# Patient Record
Sex: Female | Born: 1955
Health system: Southern US, Community
[De-identification: ages and names within clinical notes are randomized; demographics above are authoritative.]

## PROBLEM LIST (undated history)

## (undated) DIAGNOSIS — C50919 Malignant neoplasm of unspecified site of unspecified female breast: Secondary | ICD-10-CM

## (undated) DIAGNOSIS — K219 Gastro-esophageal reflux disease without esophagitis: Secondary | ICD-10-CM

## (undated) DIAGNOSIS — Z923 Personal history of irradiation: Secondary | ICD-10-CM

## (undated) DIAGNOSIS — M8580 Other specified disorders of bone density and structure, unspecified site: Secondary | ICD-10-CM

## (undated) DIAGNOSIS — E039 Hypothyroidism, unspecified: Secondary | ICD-10-CM

## (undated) HISTORY — DX: Other specified disorders of bone density and structure, unspecified site: M85.80

## (undated) HISTORY — PX: ROTATOR CUFF REPAIR: SHX139

## (undated) HISTORY — PX: TONSILLECTOMY: SUR1361

---

## 2000-02-21 ENCOUNTER — Other Ambulatory Visit: Admission: RE | Admit: 2000-02-21 | Discharge: 2000-02-21 | Payer: Self-pay | Admitting: *Deleted

## 2000-08-11 ENCOUNTER — Encounter (INDEPENDENT_AMBULATORY_CARE_PROVIDER_SITE_OTHER): Payer: Self-pay | Admitting: Specialist

## 2000-08-11 ENCOUNTER — Other Ambulatory Visit: Admission: RE | Admit: 2000-08-11 | Discharge: 2000-08-11 | Payer: Self-pay | Admitting: Internal Medicine

## 2001-05-22 ENCOUNTER — Ambulatory Visit (HOSPITAL_COMMUNITY): Admission: RE | Admit: 2001-05-22 | Discharge: 2001-05-22 | Payer: Self-pay | Admitting: Orthopedic Surgery

## 2001-05-22 ENCOUNTER — Encounter: Payer: Self-pay | Admitting: Orthopedic Surgery

## 2001-06-13 ENCOUNTER — Ambulatory Visit (HOSPITAL_COMMUNITY): Admission: RE | Admit: 2001-06-13 | Discharge: 2001-06-13 | Payer: Self-pay | Admitting: Gastroenterology

## 2004-01-14 ENCOUNTER — Ambulatory Visit (HOSPITAL_COMMUNITY): Admission: RE | Admit: 2004-01-14 | Discharge: 2004-01-14 | Payer: Self-pay | Admitting: Family Medicine

## 2004-07-15 ENCOUNTER — Other Ambulatory Visit: Admission: RE | Admit: 2004-07-15 | Discharge: 2004-07-15 | Payer: Self-pay | Admitting: Family Medicine

## 2005-07-18 ENCOUNTER — Other Ambulatory Visit: Admission: RE | Admit: 2005-07-18 | Discharge: 2005-07-18 | Payer: Self-pay | Admitting: Family Medicine

## 2006-10-03 ENCOUNTER — Other Ambulatory Visit: Admission: RE | Admit: 2006-10-03 | Discharge: 2006-10-03 | Payer: Self-pay | Admitting: Family Medicine

## 2007-10-09 ENCOUNTER — Other Ambulatory Visit: Admission: RE | Admit: 2007-10-09 | Discharge: 2007-10-09 | Payer: Self-pay | Admitting: Family Medicine

## 2007-11-08 ENCOUNTER — Encounter: Admission: RE | Admit: 2007-11-08 | Discharge: 2007-11-08 | Payer: Self-pay | Admitting: Orthopedic Surgery

## 2008-10-13 ENCOUNTER — Other Ambulatory Visit: Admission: RE | Admit: 2008-10-13 | Discharge: 2008-10-13 | Payer: Self-pay | Admitting: Family Medicine

## 2009-10-20 ENCOUNTER — Other Ambulatory Visit: Admission: RE | Admit: 2009-10-20 | Discharge: 2009-10-20 | Payer: Self-pay | Admitting: Family Medicine

## 2010-07-29 ENCOUNTER — Emergency Department (HOSPITAL_COMMUNITY): Admission: EM | Admit: 2010-07-29 | Discharge: 2010-07-29 | Payer: Self-pay | Admitting: Family Medicine

## 2010-10-28 ENCOUNTER — Other Ambulatory Visit: Admission: RE | Admit: 2010-10-28 | Discharge: 2010-10-28 | Payer: Self-pay | Admitting: Family Medicine

## 2011-05-06 NOTE — Procedures (Signed)
[facility]  Patient:    Diana Jacobson, Diana Jacobson                        MRN: [id number] Proc. Date: 06/13/01 Attending:  Anselmo Rod, M.D.                           Procedure Report  DATE OF BIRTH:  06-17-1956  REFERRING PHYSICIAN:  Stacie Acres. Cliffton Asters, M.D.  PROCEDURE PERFORMED:  Colonoscopy.  ENDOSCOPIST:  Anselmo Rod, M.D.  INSTRUMENT USED:  Olympus video colonoscope.  INDICATIONS FOR PROCEDURE:  Family history of colon cancer in a 55 year old white female rule out colonic polyps, masses, hemorrhoids, etc.  PREPROCEDURE PREPARATION:  Informed consent was procured from the patient. The patient was fasted for eight hours prior to the procedure and prepped with a bottle of magnesium citrate and a gallon of NuLytely the night prior to the procedure.  PREPROCEDURE PHYSICAL:  The patient had stable vital signs.  Neck supple. Chest clear to auscultation.  S1, S2 regular.  Abdomen soft with normal abdominal bowel sounds.  DESCRIPTION OF PROCEDURE:  The patient was placed in the left lateral decubitus position and sedated with 50 mg of Demerol and 5 mg of Versed intravenously.  Once the patient was adequately sedated and maintained on low-flow oxygen and continuous cardiac monitoring, the Olympus video colonoscope was advanced from the rectum to the cecum without difficulty. There was a question of an extrinsic compression of the cecum. The patient was wearing a large tampon.  No abnormalities were seen.  No masses or polyps were present.  The patient tolerated the procedure well without complications.  IMPRESSION:  Healthy-appearing colon.  RECOMMENDATIONS:   Repeat colorectal cancer screening is recommended in the next five years unless the patient were to develop any abnormal symptoms in the interim. DD:  06/13/01 TD:  06/13/01 Job: [id number]

## 2012-09-18 ENCOUNTER — Other Ambulatory Visit: Payer: Self-pay | Admitting: Dermatology

## 2012-11-28 ENCOUNTER — Other Ambulatory Visit: Payer: Self-pay | Admitting: Family Medicine

## 2012-11-28 ENCOUNTER — Other Ambulatory Visit (HOSPITAL_COMMUNITY)
Admission: RE | Admit: 2012-11-28 | Discharge: 2012-11-28 | Disposition: A | Discharge status: Home / Self Care | Payer: BC Managed Care – PPO | Source: Ambulatory Visit | Attending: Family Medicine | Admitting: Family Medicine

## 2012-11-28 DIAGNOSIS — Z124 Encounter for screening for malignant neoplasm of cervix: Secondary | ICD-10-CM | POA: Insufficient documentation

## 2012-11-28 DIAGNOSIS — Z1151 Encounter for screening for human papillomavirus (HPV): Secondary | ICD-10-CM | POA: Insufficient documentation

## 2014-10-23 ENCOUNTER — Other Ambulatory Visit: Payer: Self-pay | Admitting: Dermatology

## 2015-03-10 ENCOUNTER — Encounter: Payer: Self-pay | Admitting: Sports Medicine

## 2015-03-10 ENCOUNTER — Ambulatory Visit (INDEPENDENT_AMBULATORY_CARE_PROVIDER_SITE_OTHER): Payer: 59 | Admitting: Sports Medicine

## 2015-03-10 VITALS — BP 133/47 | Ht 65.0 in | Wt 145.0 lb

## 2015-03-10 DIAGNOSIS — M7632 Iliotibial band syndrome, left leg: Secondary | ICD-10-CM

## 2015-03-10 DIAGNOSIS — M25562 Pain in left knee: Secondary | ICD-10-CM

## 2015-03-10 NOTE — Progress Notes (Signed)
Subjective:     Patient ID: Diana Jacobson, female   DOB: 12-Aug-1956, 59 y.o.   MRN: [id number]  HPI Mrs. Diana Jacobson is a 59y.o female here today for a 2 month-history of left knee pain. Pt reports sharp pain that localizes to the lateral part of her knee, both above and below the knee joint. She denies any swelling or weakness. She reports that her symptoms are worse going downstairs, sitting for a period of time, and moving side to side while playing tennis. She has tried aleve, ibuprofen, ice, a knee brace, and resting which have helped mildly alleviate her symptoms.  Of note, pt reports that she had a left meniscal injury while playing tennis 13 years ago, which improved with strengthening exercises. She also tore her right lateral mensicus last year, which has also improved with quad strengthening exercises and no surgery.  Review of Systems Per HPI    Objective:   Physical Exam Filed Vitals:   03/10/15 1052  Height: 5\' 5"  (1.651 m)  Weight: 145 lb (65.772 kg)   General: pleasant female in NAD Left Knee: Full range of motion. No obvious effusion. Patient has tenderness to palpation both along the lateral joint line as well as over the distal insertion of the IT band. No soft tissue swelling. Good joint stability. 1+ patellofemoral crepitus. Pain with Thessaly's. Neurovascular intact distally. Walking without a limp.   Left knee ultrasound (03/10/15): Limited images were obtained. No obvious effusion. There is some bulging of the lateral meniscus with some fluid but no obvious tear. There is also some fluid at the insertion of the distal IT band.    Assessment:     Mrs. Diana Jacobson is a 59y.o female here today for a 2 month-history of left knee pain most c/w ITB syndrome vs. Lateral meniscal tear. Pt's pain is mostly at the distal ITB insertion site, but with pain along joint line and history of previous meniscal injury, an [place] was done today to evaluate her ITB, and to a lesser  extent, her lateral meniscus. With pt's symptoms and US findings of fluid at distal ITB, suspect that pt has ITB syndrome, however we cannot rule out a meniscal tear based on [place] results; discussed this and below plan with pt, which she is in agreement with.    Plan:     1. Provided pt with ITB exercise program to be completed daily, in addition to continuing her home quad strengthening exercises. 2. Start topical anti-inflammatory, like aspercreme, to help reduce pain, in addition to ibuprofen/Aleve as needed. 3. Continue icing daily and after rigorous activities. Will not give body helix at this time since this may compress ITB and worsen pt's symptoms. 4. Continue playing tennis, using pain as your guide. May use knee brace if helping pt while playing. 5. F/u appointment in 3 weeks; if symptoms not improving then I would consider a diagnostic/therapeutic intra-articular cortisone injection prior to further imaging.  Patient was seen and evaluated with the above medical student. I agree with the above plan of care.

## 2015-03-31 ENCOUNTER — Ambulatory Visit (INDEPENDENT_AMBULATORY_CARE_PROVIDER_SITE_OTHER): Payer: 59 | Admitting: Sports Medicine

## 2015-03-31 ENCOUNTER — Encounter: Payer: Self-pay | Admitting: Sports Medicine

## 2015-03-31 VITALS — BP 110/61 | Ht 65.0 in | Wt 145.0 lb

## 2015-03-31 DIAGNOSIS — M7632 Iliotibial band syndrome, left leg: Secondary | ICD-10-CM

## 2015-03-31 NOTE — Progress Notes (Signed)
   Subjective:    Patient ID: Diana Jacobson, female    DOB: [birth year], 59 y.o.   MRN: [id number]  HPI   Patient comes in today for follow-up on lateral left knee pain. Pain is improving with her hip abductor exercises. She was able to play tennis last week without any problems. She still denies any swelling. Aspercreme has not been very helpful but bracing and icing have been.    Review of Systems     Objective:   Physical Exam Well-developed, well-nourished. No acute distress.  Left hip: There is still some slight weakness with resisted hip abduction. No pain. Left knee: Still some tenderness to palpation along the distal IT band. No effusion. Slight tenderness along the joint line as well.  Patient has a mild genu valgus with standing. This is pronounced with running. Examination of her feet show pes planus and significant pronation with running especially on the right. She is able to run without a limp.       Assessment & Plan:  Improving left knee pain likely secondary to distal IT band syndrome.  Patient will continue with her hip abductor strengthening exercises. She is also doing quadriceps and hamstring strengthening exercises. She is working with a Physiological scientist. I've given her some hip external rotator exercises to add to her program as well. We are going to give her some green sports insoles with scaphoid pads to help correct her pronation. She will follow-up with me in 4 weeks. We will plan on custom orthotics at that time. She may continue to increase activity as tolerated using pain as her guide. If her knee pain persists or worsens I would start with plain x-rays to evaluate for the degree of lateral compartmental DJD or we could consider an intra-articular cortisone injection.

## 2015-04-28 ENCOUNTER — Ambulatory Visit (INDEPENDENT_AMBULATORY_CARE_PROVIDER_SITE_OTHER): Payer: 59 | Admitting: Sports Medicine

## 2015-04-28 ENCOUNTER — Encounter: Payer: Self-pay | Admitting: Sports Medicine

## 2015-04-28 VITALS — BP 101/50 | HR 68 | Ht 65.0 in | Wt 145.0 lb

## 2015-04-28 DIAGNOSIS — M216X9 Other acquired deformities of unspecified foot: Secondary | ICD-10-CM

## 2015-04-28 DIAGNOSIS — M7632 Iliotibial band syndrome, left leg: Secondary | ICD-10-CM | POA: Diagnosis not present

## 2015-04-28 NOTE — Progress Notes (Signed)
Patient ID: Diana Jacobson, female   DOB: 09/12/1956, 59 y.o.   MRN: [id number]  Patient comes in today for custom orthotics. Her knee is feeling much better. She has been able to play tennis without any pain. She has been compliant with her home exercises. She has found her green sports insoles to be very comfortable.  We constructed her a pair of custom orthotics today. She will wear these in her tennis shoes and workout shoes. She will continue with her home exercises. She can continue with activity as tolerated and follow-up with me when necessary.  Total time spent with the patient was 30 minutes with greater than 50% of the time spent in face-to-face consultation discussing orthotic construction, instruction, and fitting. Patient found the orthotics to be comfortable prior to leaving the office. There was a good correction of her pronation.  Patient was fitted for a : standard, cushioned, semi-rigid orthotic. The orthotic was heated and afterward the patient stood on the orthotic blank positioned on the orthotic stand. The patient was positioned in subtalar neutral position and 10 degrees of ankle dorsiflexion in a weight bearing stance. After completion of molding, a stable base was applied to the orthotic blank. The blank was ground to a stable position for weight bearing. Size:7 Base: Blue EVA Posting: none Additional orthotic padding: none

## 2015-12-31 DIAGNOSIS — R002 Palpitations: Secondary | ICD-10-CM | POA: Diagnosis not present

## 2015-12-31 DIAGNOSIS — M8580 Other specified disorders of bone density and structure, unspecified site: Secondary | ICD-10-CM | POA: Diagnosis not present

## 2015-12-31 DIAGNOSIS — Z124 Encounter for screening for malignant neoplasm of cervix: Secondary | ICD-10-CM | POA: Diagnosis not present

## 2015-12-31 DIAGNOSIS — E039 Hypothyroidism, unspecified: Secondary | ICD-10-CM | POA: Diagnosis not present

## 2015-12-31 DIAGNOSIS — Z0000 Encounter for general adult medical examination without abnormal findings: Secondary | ICD-10-CM | POA: Diagnosis not present

## 2015-12-31 DIAGNOSIS — Z13220 Encounter for screening for lipoid disorders: Secondary | ICD-10-CM | POA: Diagnosis not present

## 2016-01-06 DIAGNOSIS — M5416 Radiculopathy, lumbar region: Secondary | ICD-10-CM | POA: Diagnosis not present

## 2016-01-06 DIAGNOSIS — M9903 Segmental and somatic dysfunction of lumbar region: Secondary | ICD-10-CM | POA: Diagnosis not present

## 2016-01-06 DIAGNOSIS — M9901 Segmental and somatic dysfunction of cervical region: Secondary | ICD-10-CM | POA: Diagnosis not present

## 2016-01-06 DIAGNOSIS — M5033 Other cervical disc degeneration, cervicothoracic region: Secondary | ICD-10-CM | POA: Diagnosis not present

## 2016-02-03 DIAGNOSIS — M5033 Other cervical disc degeneration, cervicothoracic region: Secondary | ICD-10-CM | POA: Diagnosis not present

## 2016-02-03 DIAGNOSIS — M9903 Segmental and somatic dysfunction of lumbar region: Secondary | ICD-10-CM | POA: Diagnosis not present

## 2016-02-03 DIAGNOSIS — M9901 Segmental and somatic dysfunction of cervical region: Secondary | ICD-10-CM | POA: Diagnosis not present

## 2016-02-03 DIAGNOSIS — M5416 Radiculopathy, lumbar region: Secondary | ICD-10-CM | POA: Diagnosis not present

## 2016-02-29 DIAGNOSIS — M5416 Radiculopathy, lumbar region: Secondary | ICD-10-CM | POA: Diagnosis not present

## 2016-02-29 DIAGNOSIS — M9903 Segmental and somatic dysfunction of lumbar region: Secondary | ICD-10-CM | POA: Diagnosis not present

## 2016-02-29 DIAGNOSIS — M5033 Other cervical disc degeneration, cervicothoracic region: Secondary | ICD-10-CM | POA: Diagnosis not present

## 2016-02-29 DIAGNOSIS — M9901 Segmental and somatic dysfunction of cervical region: Secondary | ICD-10-CM | POA: Diagnosis not present

## 2016-03-25 DIAGNOSIS — Z0100 Encounter for examination of eyes and vision without abnormal findings: Secondary | ICD-10-CM | POA: Diagnosis not present

## 2016-03-30 DIAGNOSIS — M5416 Radiculopathy, lumbar region: Secondary | ICD-10-CM | POA: Diagnosis not present

## 2016-03-30 DIAGNOSIS — M5033 Other cervical disc degeneration, cervicothoracic region: Secondary | ICD-10-CM | POA: Diagnosis not present

## 2016-03-30 DIAGNOSIS — M9903 Segmental and somatic dysfunction of lumbar region: Secondary | ICD-10-CM | POA: Diagnosis not present

## 2016-03-30 DIAGNOSIS — M9901 Segmental and somatic dysfunction of cervical region: Secondary | ICD-10-CM | POA: Diagnosis not present

## 2016-04-01 DIAGNOSIS — M85852 Other specified disorders of bone density and structure, left thigh: Secondary | ICD-10-CM | POA: Diagnosis not present

## 2016-04-01 DIAGNOSIS — Z1231 Encounter for screening mammogram for malignant neoplasm of breast: Secondary | ICD-10-CM | POA: Diagnosis not present

## 2016-04-01 DIAGNOSIS — M85851 Other specified disorders of bone density and structure, right thigh: Secondary | ICD-10-CM | POA: Diagnosis not present

## 2016-04-27 DIAGNOSIS — M9901 Segmental and somatic dysfunction of cervical region: Secondary | ICD-10-CM | POA: Diagnosis not present

## 2016-04-27 DIAGNOSIS — M9903 Segmental and somatic dysfunction of lumbar region: Secondary | ICD-10-CM | POA: Diagnosis not present

## 2016-04-27 DIAGNOSIS — M5416 Radiculopathy, lumbar region: Secondary | ICD-10-CM | POA: Diagnosis not present

## 2016-04-27 DIAGNOSIS — M5033 Other cervical disc degeneration, cervicothoracic region: Secondary | ICD-10-CM | POA: Diagnosis not present

## 2016-05-25 DIAGNOSIS — M9903 Segmental and somatic dysfunction of lumbar region: Secondary | ICD-10-CM | POA: Diagnosis not present

## 2016-05-25 DIAGNOSIS — M5033 Other cervical disc degeneration, cervicothoracic region: Secondary | ICD-10-CM | POA: Diagnosis not present

## 2016-05-25 DIAGNOSIS — M9901 Segmental and somatic dysfunction of cervical region: Secondary | ICD-10-CM | POA: Diagnosis not present

## 2016-05-25 DIAGNOSIS — M5416 Radiculopathy, lumbar region: Secondary | ICD-10-CM | POA: Diagnosis not present

## 2016-06-28 DIAGNOSIS — M5033 Other cervical disc degeneration, cervicothoracic region: Secondary | ICD-10-CM | POA: Diagnosis not present

## 2016-06-28 DIAGNOSIS — M9903 Segmental and somatic dysfunction of lumbar region: Secondary | ICD-10-CM | POA: Diagnosis not present

## 2016-06-28 DIAGNOSIS — M5416 Radiculopathy, lumbar region: Secondary | ICD-10-CM | POA: Diagnosis not present

## 2016-06-28 DIAGNOSIS — M9901 Segmental and somatic dysfunction of cervical region: Secondary | ICD-10-CM | POA: Diagnosis not present

## 2016-07-26 DIAGNOSIS — M5416 Radiculopathy, lumbar region: Secondary | ICD-10-CM | POA: Diagnosis not present

## 2016-07-26 DIAGNOSIS — M5033 Other cervical disc degeneration, cervicothoracic region: Secondary | ICD-10-CM | POA: Diagnosis not present

## 2016-07-26 DIAGNOSIS — M9901 Segmental and somatic dysfunction of cervical region: Secondary | ICD-10-CM | POA: Diagnosis not present

## 2016-07-26 DIAGNOSIS — M9903 Segmental and somatic dysfunction of lumbar region: Secondary | ICD-10-CM | POA: Diagnosis not present

## 2016-07-30 DIAGNOSIS — J019 Acute sinusitis, unspecified: Secondary | ICD-10-CM | POA: Diagnosis not present

## 2016-09-06 DIAGNOSIS — M5416 Radiculopathy, lumbar region: Secondary | ICD-10-CM | POA: Diagnosis not present

## 2016-09-06 DIAGNOSIS — M9901 Segmental and somatic dysfunction of cervical region: Secondary | ICD-10-CM | POA: Diagnosis not present

## 2016-09-06 DIAGNOSIS — M9903 Segmental and somatic dysfunction of lumbar region: Secondary | ICD-10-CM | POA: Diagnosis not present

## 2016-09-06 DIAGNOSIS — M5033 Other cervical disc degeneration, cervicothoracic region: Secondary | ICD-10-CM | POA: Diagnosis not present

## 2016-09-28 ENCOUNTER — Encounter: Payer: Self-pay | Admitting: Physician Assistant

## 2016-09-28 ENCOUNTER — Ambulatory Visit: Payer: Self-pay | Admitting: Physician Assistant

## 2016-09-28 VITALS — BP 140/80 | HR 76 | Temp 98.5°F

## 2016-09-28 DIAGNOSIS — R05 Cough: Secondary | ICD-10-CM

## 2016-09-28 DIAGNOSIS — R059 Cough, unspecified: Secondary | ICD-10-CM

## 2016-09-28 DIAGNOSIS — J0101 Acute recurrent maxillary sinusitis: Secondary | ICD-10-CM

## 2016-09-28 MED ORDER — PREDNISONE 10 MG PO TABS: 30.0000 mg | ORAL_TABLET | Freq: Every day | ORAL | 0 refills | Status: DC

## 2016-09-28 MED ORDER — CEFDINIR 300 MG PO CAPS: 300.0000 mg | ORAL_CAPSULE | Freq: Two times a day (BID) | ORAL | 0 refills | Status: DC

## 2016-09-28 MED ORDER — LEVOCETIRIZINE DIHYDROCHLORIDE 5 MG PO TABS: 5.0000 mg | ORAL_TABLET | Freq: Every evening | ORAL | 12 refills | Status: DC

## 2016-09-28 NOTE — Progress Notes (Signed)
S: c/o continued sinus pain and pressure, still has a cough, will come and go, sx on and off for a month, mucus is colored, still using nasacort, xyrtec without a lot of relief, added mucinex d without relief, now has sore throat, started 3 d ago, was exposed to grandchildren that had been on antibiotic for strep, denies fever chills, cp/sob, v/d  O: vitals wnl, nad, tms dull, nasal mucosa red and swollen, throat wnl, neck supple no lymph, lungs c ta , cv rrr  A: acute sinusitis, cough  P: omnicef, xyzal instead of xyrtec, pred 30mg  qd x 3d, consider taking reflux med if cough persisits, need cxr if cough persists for more that a few weeks

## 2016-10-04 DIAGNOSIS — M5416 Radiculopathy, lumbar region: Secondary | ICD-10-CM | POA: Diagnosis not present

## 2016-10-04 DIAGNOSIS — M9901 Segmental and somatic dysfunction of cervical region: Secondary | ICD-10-CM | POA: Diagnosis not present

## 2016-10-04 DIAGNOSIS — M9903 Segmental and somatic dysfunction of lumbar region: Secondary | ICD-10-CM | POA: Diagnosis not present

## 2016-10-04 DIAGNOSIS — M5033 Other cervical disc degeneration, cervicothoracic region: Secondary | ICD-10-CM | POA: Diagnosis not present

## 2016-10-15 DIAGNOSIS — H1013 Acute atopic conjunctivitis, bilateral: Secondary | ICD-10-CM | POA: Diagnosis not present

## 2016-11-01 DIAGNOSIS — M9901 Segmental and somatic dysfunction of cervical region: Secondary | ICD-10-CM | POA: Diagnosis not present

## 2016-11-01 DIAGNOSIS — M5416 Radiculopathy, lumbar region: Secondary | ICD-10-CM | POA: Diagnosis not present

## 2016-11-01 DIAGNOSIS — M5033 Other cervical disc degeneration, cervicothoracic region: Secondary | ICD-10-CM | POA: Diagnosis not present

## 2016-11-01 DIAGNOSIS — M9903 Segmental and somatic dysfunction of lumbar region: Secondary | ICD-10-CM | POA: Diagnosis not present

## 2016-11-18 DIAGNOSIS — D225 Melanocytic nevi of trunk: Secondary | ICD-10-CM | POA: Diagnosis not present

## 2016-11-18 DIAGNOSIS — L820 Inflamed seborrheic keratosis: Secondary | ICD-10-CM | POA: Diagnosis not present

## 2016-11-18 DIAGNOSIS — D2271 Melanocytic nevi of right lower limb, including hip: Secondary | ICD-10-CM | POA: Diagnosis not present

## 2016-11-18 DIAGNOSIS — Z23 Encounter for immunization: Secondary | ICD-10-CM | POA: Diagnosis not present

## 2016-11-29 DIAGNOSIS — M5033 Other cervical disc degeneration, cervicothoracic region: Secondary | ICD-10-CM | POA: Diagnosis not present

## 2016-11-29 DIAGNOSIS — M9903 Segmental and somatic dysfunction of lumbar region: Secondary | ICD-10-CM | POA: Diagnosis not present

## 2016-11-29 DIAGNOSIS — M5416 Radiculopathy, lumbar region: Secondary | ICD-10-CM | POA: Diagnosis not present

## 2016-11-29 DIAGNOSIS — M9901 Segmental and somatic dysfunction of cervical region: Secondary | ICD-10-CM | POA: Diagnosis not present

## 2017-01-02 ENCOUNTER — Other Ambulatory Visit: Payer: Self-pay | Admitting: Family Medicine

## 2017-01-02 ENCOUNTER — Other Ambulatory Visit (HOSPITAL_COMMUNITY)
Admission: RE | Admit: 2017-01-02 | Discharge: 2017-01-02 | Disposition: A | Discharge status: Home / Self Care | Payer: 59 | Source: Ambulatory Visit | Attending: Family Medicine | Admitting: Family Medicine

## 2017-01-02 DIAGNOSIS — E039 Hypothyroidism, unspecified: Secondary | ICD-10-CM | POA: Diagnosis not present

## 2017-01-02 DIAGNOSIS — Z124 Encounter for screening for malignant neoplasm of cervix: Secondary | ICD-10-CM | POA: Diagnosis not present

## 2017-01-02 DIAGNOSIS — Z1151 Encounter for screening for human papillomavirus (HPV): Secondary | ICD-10-CM | POA: Insufficient documentation

## 2017-01-02 DIAGNOSIS — Z0000 Encounter for general adult medical examination without abnormal findings: Secondary | ICD-10-CM | POA: Diagnosis not present

## 2017-01-02 DIAGNOSIS — Z23 Encounter for immunization: Secondary | ICD-10-CM | POA: Diagnosis not present

## 2017-01-02 DIAGNOSIS — Z13220 Encounter for screening for lipoid disorders: Secondary | ICD-10-CM | POA: Diagnosis not present

## 2017-01-03 LAB — CYTOLOGY - PAP
DIAGNOSIS: NEGATIVE
HPV: NOT DETECTED

## 2017-04-20 DIAGNOSIS — Z1231 Encounter for screening mammogram for malignant neoplasm of breast: Secondary | ICD-10-CM | POA: Diagnosis not present

## 2017-04-25 DIAGNOSIS — R921 Mammographic calcification found on diagnostic imaging of breast: Secondary | ICD-10-CM | POA: Diagnosis not present

## 2017-05-03 ENCOUNTER — Other Ambulatory Visit: Payer: Self-pay | Admitting: Radiology

## 2017-05-03 DIAGNOSIS — R599 Enlarged lymph nodes, unspecified: Secondary | ICD-10-CM | POA: Diagnosis not present

## 2017-05-03 DIAGNOSIS — C50911 Malignant neoplasm of unspecified site of right female breast: Secondary | ICD-10-CM | POA: Diagnosis not present

## 2017-05-03 DIAGNOSIS — C50919 Malignant neoplasm of unspecified site of unspecified female breast: Secondary | ICD-10-CM

## 2017-05-03 DIAGNOSIS — D0591 Unspecified type of carcinoma in situ of right breast: Secondary | ICD-10-CM | POA: Diagnosis not present

## 2017-05-03 DIAGNOSIS — C50411 Malignant neoplasm of upper-outer quadrant of right female breast: Secondary | ICD-10-CM | POA: Diagnosis not present

## 2017-05-03 HISTORY — DX: Malignant neoplasm of unspecified site of unspecified female breast: C50.919

## 2017-05-03 HISTORY — PX: BREAST BIOPSY: SHX20

## 2017-05-05 ENCOUNTER — Telehealth: Payer: Self-pay | Admitting: *Deleted

## 2017-05-05 NOTE — Telephone Encounter (Signed)
Left message for a return phone call to schedule for [facility] 05/10/17.

## 2017-05-05 NOTE — Telephone Encounter (Signed)
Patient is unsure whether she wants to pick her own team or come to clinic.  She will call me back and let me know.

## 2017-05-08 ENCOUNTER — Telehealth: Payer: Self-pay | Admitting: *Deleted

## 2017-05-08 NOTE — Telephone Encounter (Signed)
Received message back from patient that she will come to the [facility] 5/23.  Confirmed [facility] for 05/10/17 at 12:15pm .  Instructions and contact information given.

## 2017-05-09 ENCOUNTER — Other Ambulatory Visit: Payer: Self-pay

## 2017-05-09 ENCOUNTER — Other Ambulatory Visit: Payer: Self-pay | Admitting: *Deleted

## 2017-05-09 DIAGNOSIS — C50411 Malignant neoplasm of upper-outer quadrant of right female breast: Secondary | ICD-10-CM | POA: Insufficient documentation

## 2017-05-09 DIAGNOSIS — Z170 Estrogen receptor positive status [ER+]: Principal | ICD-10-CM

## 2017-05-10 ENCOUNTER — Encounter: Payer: Self-pay | Admitting: Hematology and Oncology

## 2017-05-10 ENCOUNTER — Ambulatory Visit
Admission: RE | Admit: 2017-05-10 | Discharge: 2017-05-10 | Disposition: A | Discharge status: Home / Self Care | Payer: 59 | Source: Ambulatory Visit | Attending: Radiation Oncology | Admitting: Radiation Oncology

## 2017-05-10 ENCOUNTER — Ambulatory Visit (HOSPITAL_BASED_OUTPATIENT_CLINIC_OR_DEPARTMENT_OTHER): Payer: 59 | Admitting: Hematology and Oncology

## 2017-05-10 ENCOUNTER — Ambulatory Visit: Payer: 59 | Attending: General Surgery | Admitting: Physical Therapy

## 2017-05-10 ENCOUNTER — Other Ambulatory Visit: Payer: Self-pay | Admitting: General Surgery

## 2017-05-10 ENCOUNTER — Encounter: Payer: Self-pay | Admitting: Physical Therapy

## 2017-05-10 ENCOUNTER — Encounter: Payer: Self-pay | Admitting: Radiation Oncology

## 2017-05-10 ENCOUNTER — Other Ambulatory Visit (HOSPITAL_BASED_OUTPATIENT_CLINIC_OR_DEPARTMENT_OTHER): Payer: 59

## 2017-05-10 DIAGNOSIS — C50411 Malignant neoplasm of upper-outer quadrant of right female breast: Secondary | ICD-10-CM | POA: Diagnosis not present

## 2017-05-10 DIAGNOSIS — Z170 Estrogen receptor positive status [ER+]: Principal | ICD-10-CM

## 2017-05-10 DIAGNOSIS — C50511 Malignant neoplasm of lower-outer quadrant of right female breast: Secondary | ICD-10-CM

## 2017-05-10 DIAGNOSIS — R293 Abnormal posture: Secondary | ICD-10-CM | POA: Diagnosis not present

## 2017-05-10 LAB — CBC WITH DIFFERENTIAL/PLATELET
BASO%: 0.7 % (ref 0.0–2.0)
BASOS ABS: 0 10*3/uL (ref 0.0–0.1)
EOS%: 2.4 % (ref 0.0–7.0)
Eosinophils Absolute: 0.1 10*3/uL (ref 0.0–0.5)
HEMATOCRIT: 40.8 % (ref 34.8–46.6)
HEMOGLOBIN: 13.7 g/dL (ref 11.6–15.9)
LYMPH#: 1.3 10*3/uL (ref 0.9–3.3)
LYMPH%: 27.8 % (ref 14.0–49.7)
MCH: 30.4 pg (ref 25.1–34.0)
MCHC: 33.6 g/dL (ref 31.5–36.0)
MCV: 90.5 fL (ref 79.5–101.0)
MONO#: 0.4 10*3/uL (ref 0.1–0.9)
MONO%: 8 % (ref 0.0–14.0)
NEUT%: 61.1 % (ref 38.4–76.8)
NEUTROS ABS: 2.8 10*3/uL (ref 1.5–6.5)
Platelets: 229 10*3/uL (ref 145–400)
RBC: 4.51 10*6/uL (ref 3.70–5.45)
RDW: 13.1 % (ref 11.2–14.5)
WBC: 4.6 10*3/uL (ref 3.9–10.3)

## 2017-05-10 LAB — COMPREHENSIVE METABOLIC PANEL
ALT: 14 U/L (ref 0–55)
AST: 18 U/L (ref 5–34)
Albumin: 4.2 g/dL (ref 3.5–5.0)
Alkaline Phosphatase: 75 U/L (ref 40–150)
Anion Gap: 7 mEq/L (ref 3–11)
BUN: 15.5 mg/dL (ref 7.0–26.0)
CALCIUM: 9.8 mg/dL (ref 8.4–10.4)
CHLORIDE: 106 meq/L (ref 98–109)
CO2: 28 meq/L (ref 22–29)
Creatinine: 0.8 mg/dL (ref 0.6–1.1)
EGFR: 75 mL/min/{1.73_m2} — ABNORMAL LOW (ref >90)
GLUCOSE: 94 mg/dL (ref 70–140)
Potassium: 4.2 mEq/L (ref 3.5–5.1)
SODIUM: 140 meq/L (ref 136–145)
Total Bilirubin: 0.49 mg/dL (ref 0.20–1.20)
Total Protein: 6.9 g/dL (ref 6.4–8.3)

## 2017-05-10 MED ORDER — KETOROLAC TROMETHAMINE 15 MG/ML IJ SOLN: 15.0000 mg | Freq: Once | INTRAMUSCULAR | Status: AC

## 2017-05-10 NOTE — Patient Instructions (Signed)

## 2017-05-10 NOTE — Assessment & Plan Note (Signed)
05/03/2017: Screening detected right breast calcifications 7 mm, right axilla negative, tumor biopsy grade 3 IDC with DCIS plus LCIS ER 100%, PR 2%, HER-2 negative ratio 1.45, Ki-67 25%, T1 BN 0 stage IA clinical stage  Pathology and radiology counseling:Discussed with the patient, the details of pathology including the type of breast cancer,the clinical staging, the significance of ER, PR and HER-2/neu receptors and the implications for treatment. After reviewing the pathology in detail, we proceeded to discuss the different treatment options between surgery, radiation, chemotherapy, antiestrogen therapies.  Recommendations: 1. Breast conserving surgery followed by 2. Oncotype DX testing to determine if chemotherapy would be of any benefit followed by 3. Adjuvant radiation therapy followed by 4. Adjuvant antiestrogen therapy  Oncotype counseling: I discussed Oncotype DX test. I explained to the patient that this is a 21 gene panel to evaluate patient tumors DNA to calculate recurrence score. This would help determine whether patient has high risk or intermediate risk or low risk breast cancer. She understands that if her tumor was found to be high risk, she would benefit from systemic chemotherapy. If low risk, no need of chemotherapy. If she was found to be intermediate risk, we would need to evaluate the score as well as other risk factors and determine if an abbreviated chemotherapy may be of benefit.  Return to clinic after surgery to discuss final pathology report and then determine if Oncotype DX testing will need to be sent.

## 2017-05-10 NOTE — Progress Notes (Signed)
Radiation Oncology         [phone] ________________________________  Initial Outpatient Consultation  Name: Diana Jacobson MRN: [id number]  Date: 05/10/2017  DOB: 1956/05/29  CC:Harlan Stains, MD  Rolm Bookbinder, MD   REFERRING PHYSICIAN: Rolm Bookbinder, MD  DIAGNOSIS: The encounter diagnosis was Malignant neoplasm of upper-outer quadrant of right breast in female, estrogen receptor positive ([place]).  Clinical Stage IB (T1b Nx Mx) Right Breast UOQ Invasive Ductal Carcinoma, ER 100%, PR 2%, Her2 negative, grade 3  HISTORY OF PRESENT ILLNESS::Diana Jacobson is a 61 y.o. female who initially presented with screening detected right breast calcifications at [facility].  Biopsy of the right breast on 05/03/17 revealed invasive mammary carcinoma and mammary carcinoma in situ, grade 3, and measuring 0.9 cm in greatest dimension.  On review of systems, the patient is positive for loss of sleep, ongoing arthritis, and thyroid problems.  Gynecologic History: Age at first menstrual period? 11 Are you still having periods? No  Approximate date of last period? December 2009 If you no longer have periods: Have you used hormone replacement? Yes, for 6 months. Stopped 2010. Obstetric History:  How many children have you carried to term? 4  Your age at first live birth? 49 Pregnant now or trying to get pregnant? No Have you used birth control pills or hormone shots for contraception? Yes If so, for how long (or approximate dates)? 3 months Health Maintenance: Have you ever had a colonoscopy? Yes  If yes, date? 2011 Have you ever had a bone density? Yes Date of your last PAP smear? January 2018  Date of your FIRST mammogram? 1990  PREVIOUS RADIATION THERAPY: No  PAST MEDICAL HISTORY:  has a past medical history of Osteopenia.    PAST SURGICAL HISTORY: Past Surgical History:  Procedure Laterality Date  . ROTATOR CUFF REPAIR    . TONSILLECTOMY      FAMILY HISTORY: family history  includes Cancer (age of onset: 37) in her cousin; Cancer (age of onset: 69) in her mother; Cancer (age of onset: 86) in her father; Cancer (age of onset: 86) in her maternal grandmother; Colon cancer in her father and maternal grandmother; Lung cancer in her mother.  SOCIAL HISTORY:  reports that she quit smoking about 42 years ago. She does not have any smokeless tobacco history on file. She reports that she drinks about 3.0 oz of alcohol per week . She reports that she does not use drugs.  ALLERGIES: Patient has no known allergies.  MEDICATIONS:  Current Outpatient Prescriptions  Medication Sig Dispense Refill  . Bioflavonoid Products (ESTER-C) 500-200-60 MG TABS Take 1 tablet by mouth daily.    . Calcium 500-125 MG-UNIT TABS Take 2 tablets by mouth daily.    . cefdinir (OMNICEF) 300 MG capsule Take 1 capsule (300 mg total) by mouth 2 (two) times daily. (Patient not taking: Reported on 05/10/2017) 20 capsule 0  . glucosamine-chondroitin 500-400 MG tablet Take 1 tablet by mouth daily.    Marland Kitchen levocetirizine (XYZAL) 5 MG tablet Take 1 tablet (5 mg total) by mouth every evening. 30 tablet 12  . levothyroxine (SYNTHROID, LEVOTHROID) 88 MCG tablet Take 88 mcg by mouth daily before breakfast.    . Multiple Vitamin (MULTIVITAMIN) tablet Take 1 tablet by mouth daily.    . NON FORMULARY Take 1 tablet by mouth daily. DIGEST ZEN TABLET    . NON FORMULARY Take 1 tablet by mouth daily. phytoestrogen for hot flashes    . predniSONE (DELTASONE) 10 MG  tablet Take 3 tablets (30 mg total) by mouth daily with breakfast. (Patient not taking: Reported on 05/10/2017) 9 tablet 0   No current facility-administered medications for this encounter.     REVIEW OF SYSTEMS:  A 10+ POINT REVIEW OF SYSTEMS WAS OBTAINED including neurology, dermatology, psychiatry, cardiac, respiratory, lymph, extremities, GI, GU, musculoskeletal, constitutional, reproductive, HEENT. All pertinent positives are noted in the HPI. All others are  negative.   PHYSICAL EXAM:  Vitals with BMI 05/10/2017  Height 5' 5"   Weight 156 lbs 2 oz  BMI 26  Systolic 001  Diastolic 48  Pulse 68  Respirations 18  Lungs are clear to auscultation bilaterally. Heart has regular rate and rhythm. No palpable cervical, supraclavicular, or axillary adenopathy.  Left breast shows no palpable mass or nipple discharge. Right breast shows bruising in upper-outer quadrant with extension of bruising into the lower-outer quadrant. There is a steri-strip in place in the upper-outer quadrant. No palpable right breast mass, appreciable nipple discharge, or bleeding.   ECOG = 1   LABORATORY DATA:  Lab Results  Component Value Date   WBC 4.6 05/10/2017   HGB 13.7 05/10/2017   HCT 40.8 05/10/2017   MCV 90.5 05/10/2017   PLT 229 05/10/2017   NEUTROABS 2.8 05/10/2017   Lab Results  Component Value Date   NA 140 05/10/2017   K 4.2 05/10/2017   CO2 28 05/10/2017   GLUCOSE 94 05/10/2017   CREATININE 0.8 05/10/2017   CALCIUM 9.8 05/10/2017      RADIOGRAPHY: [facility] images reviewed    IMPRESSION: This is a 61 y.o. woman found to have Clinical stage IB invasive ductal carcinoma of the right breast, grade 3. Patient is a good candidate for breast conservation therapy with right lumpectomy and sentinel lymph node procedure followed by adjuvant radiation therapy.  PLAN: The patient works with [facility] at [place] and may wish to have her radiation therapy performed at that location. She will also have Oncotype Dx testing to see if she would be a good candidate for adjuvant chemotherapy. She will proceed with aromatase inhibitor at a later date as adjuvant hormonal therapy.      ------------------------------------------------  Blair Promise, PhD, MD  This document serves as a record of services personally performed by Gery Pray, MD. It was created on his behalf by Maryla Morrow, a trained medical scribe. The creation of this record is  based on the scribe's personal observations and the provider's statements to them. This document has been checked and approved by the attending provider.

## 2017-05-10 NOTE — Therapy (Signed)
[place], [place], [address] Phone: [phone]   Fax:  [phone]  Physical Therapy Evaluation  Patient Details  Name: Diana Jacobson MRN: [id number] Date of Birth: [birth year] Referring Provider: Dr. Rolm Bookbinder  Encounter Date: 05/10/2017      PT End of Session - 05/10/17 1640    Visit Number 1   Number of Visits 1   PT Start Time 0623   PT Stop Time 1428   PT Time Calculation (min) 33 min   Activity Tolerance Patient tolerated treatment well   Behavior During Therapy [facility] for tasks assessed/performed      Past Medical History:  Diagnosis Date  . Osteopenia     Past Surgical History:  Procedure Laterality Date  . ROTATOR CUFF REPAIR    . TONSILLECTOMY      There were no vitals filed for this visit.       Subjective Assessment - 05/10/17 1628    Subjective Patient reports she is here to meet with her medical team for her newly diagnosed right breast cancer.   Patient is accompained by: Family member   Pertinent History Patient was diagnosed on 04/20/17 with right invasive ductal carcinoma breast cancer. It measures 7 mm and is located in the upper outer quadrant. It is ER/PR positive and HER2 negative with a Ki67 of 25%. She had a previous right rotator cuff repair in 2011 from a traumatic fall. She regained ROM in rehab after that.   Patient Stated Goals Reduce lymphedema risk and learn post op shoulder ROM HEP   Currently in Pain? No/denies            [facility] PT Assessment - 05/10/17 0001      Assessment   Medical Diagnosis Right breast cancer   Referring Provider Dr. Rolm Bookbinder   Onset Date/Surgical Date 04/20/17   Hand Dominance Right   Prior Therapy none     Precautions   Precautions Other (comment)   Precaution Comments active cancer     Restrictions   Weight Bearing Restrictions No     Balance Screen   Has the patient fallen in the past 6 months No    Has the patient had a decrease in activity level because of a fear of falling?  No   Is the patient reluctant to leave their home because of a fear of falling?  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   Leisure Tennis 2x/week and personal trainer 1x/week; walks 3x/week     Cognition   Overall Cognitive Status Within Functional Limits for tasks assessed     Posture/Postural Control   Posture/Postural Control Postural limitations   Postural Limitations Forward head;Rounded Shoulders     ROM / Strength   AROM / PROM / Strength Strength;AROM     AROM   AROM Assessment Site Shoulder;Cervical   Right/Left Shoulder Right;Left   Right Shoulder Extension 42 Degrees   Right Shoulder Flexion 142 Degrees   Right Shoulder ABduction 172 Degrees   Right Shoulder Internal Rotation 79 Degrees   Right Shoulder External Rotation 81 Degrees   Left Shoulder Extension 57 Degrees   Left Shoulder Flexion 152 Degrees  Left Shoulder ABduction 172 Degrees   Left Shoulder Internal Rotation 69 Degrees   Left Shoulder External Rotation 86 Degrees   Cervical Flexion WNL   Cervical Extension WNL   Cervical - Right Side Bend 25% limited   Cervical - Left Side Bend 25% limited   Cervical - Right Rotation 25% limited   Cervical - Left Rotation 25% limited     Strength   Overall Strength Within functional limits for tasks performed           LYMPHEDEMA/ONCOLOGY QUESTIONNAIRE - 05/10/17 1638      Type   Cancer Type Right breast cancer     Lymphedema Assessments   Lymphedema Assessments Upper extremities     Right Upper Extremity Lymphedema   10 cm Proximal to Olecranon Process 28.5 cm   Olecranon Process 25 cm    10 cm Proximal to Ulnar Styloid Process 21 cm   Just Proximal to Ulnar Styloid Process 15.3 cm   Across Hand at [employer] 18.2 cm   At [place] of 2nd Digit 6.2 cm     Left Upper Extremity Lymphedema   10 cm Proximal to Olecranon Process 29.2 cm   Olecranon Process 24.1 cm   10 cm Proximal to Ulnar Styloid Process 20.9 cm   Just Proximal to Ulnar Styloid Process 15.2 cm   Across Hand at [employer] 17.7 cm   At [place] of 2nd Digit 6 cm      Patient was instructed today in a home exercise program today for post op shoulder range of motion. These included active assist shoulder flexion in sitting, scapular retraction, wall walking with shoulder abduction, and hands behind head external rotation.  She was encouraged to do these twice a day, holding 3 seconds and repeating 5 times when permitted by her physician.         PT Education - 05/10/17 1639    Education provided Yes   Education Details Lymphedema risk reduction and post op shoulder ROM HEP   Person(s) Educated Patient;Spouse   Methods Explanation;Demonstration;Handout   Comprehension Returned demonstration;Verbalized understanding              Breast Clinic Goals - 05/10/17 1643      Patient will be able to verbalize understanding of pertinent lymphedema risk reduction practices relevant to her diagnosis specifically related to skin care.   Time 1   Period Days   Status Achieved     Patient will be able to return demonstrate and/or verbalize understanding of the post-op home exercise program related to regaining shoulder range of motion.   Time 1   Period Days   Status Achieved     Patient will be able to verbalize understanding of the importance of attending the postoperative After Breast Cancer Class for further lymphedema risk reduction education and therapeutic exercise.   Time 1   Period Days   Status Achieved              Plan - 05/10/17 1640    Clinical Impression Statement Patient was  diagnosed on 04/20/17 with right invasive ductal carcinoma breast cancer. It measures 7 mm and is located in the upper outer quadrant. It is ER/PR positive and HER2 negative with a Ki67 of 25%. She had a previous right rotator cuff repair in 2011 from a traumatic fall. She regained ROM in rehab after that. Her multidisciplinary medical team met prior to hre assessments to determine a recommended treatment plan. She is planning  to have a right lumpectomy and sentinel node biopsy followed by Oncotype testing, radiation, and anti-estrogen therapy. She may benefit from post op PT to regain shoulder ROM and reduce lymphedema risk. Due to her lack of comorbidities, her eval is low complexity.   Rehab Potential Excellent   Clinical Impairments Affecting Rehab Potential None   PT Frequency One time visit   PT Treatment/Interventions Patient/family education;Therapeutic exercise   PT Next Visit Plan Will f/u after surgery to determine PT needs   PT Home Exercise Plan Post op shoulder ROM HEP   Consulted and Agree with Plan of Care Patient;Family member/caregiver   Family Member Consulted Husband      Patient will benefit from skilled therapeutic intervention in order to improve the following deficits and impairments:  Postural dysfunction, Decreased knowledge of precautions, Pain, Impaired UE functional use, Decreased range of motion  Visit Diagnosis: Carcinoma of upper-outer quadrant of right breast in female, estrogen receptor positive ([facility]) - Plan: PT plan of care cert/re-cert  Abnormal posture - Plan: PT plan of care cert/re-cert   Patient will follow up at outpatient cancer rehab if needed following surgery.  If the patient requires physical therapy at that time, a specific plan will be dictated and sent to the referring physician for approval. The patient was educated today on appropriate basic range of motion exercises to begin post operatively and the importance of attending the After Breast Cancer  class following surgery.  Patient was educated today on lymphedema risk reduction practices as it pertains to recommendations that will benefit the patient immediately following surgery.  She verbalized good understanding.  No additional physical therapy is indicated at this time.     Problem List Patient Active Problem List   Diagnosis Date Noted  . Malignant neoplasm of upper-outer quadrant of right breast in female, estrogen receptor positive ([facility]) 05/09/2017    Annia Friendly, PT 05/10/17 4:45 PM  [place], [place], [address] Phone: [phone]   Fax:  [phone]  Name: Diana Jacobson MRN: [id number] Date of Birth: [birth year]

## 2017-05-10 NOTE — Progress Notes (Signed)
[facility] NOTE  Patient Care Team: Harlan Stains, MD as PCP - General (Family Medicine) Rolm Bookbinder, MD as Consulting Physician (General Surgery) Nicholas Lose, MD as Consulting Physician (Hematology and Oncology) Gery Pray, MD as Consulting Physician (Radiation Oncology)  CHIEF COMPLAINTS/PURPOSE OF CONSULTATION:  Newly diagnosed breast cancer  HISTORY OF PRESENTING ILLNESS:  Diana Jacobson 61 y.o. female is here because of recent diagnosis of right breast cancer. Patient had a screening mammogram the detected right breast calcifications measuring 7 mm in size. Right axilla was negative. Tumor biopsy revealing grade 3 invasive ductal carcinoma with DCIS and LCIS. The tumor is ER/PR positive HER-2 negative with a Ki-67 25%. She was presented this morning in the multidisciplinary tumor board and she is here today to discuss the treatment.  I reviewed her records extensively and collaborated the history with the patient.  SUMMARY OF ONCOLOGIC HISTORY:   Malignant neoplasm of upper-outer quadrant of right breast in female, estrogen receptor positive ([facility])   05/03/2017 Initial Diagnosis    Screening detected right breast calcifications 7 mm, right axilla negative, tumor biopsy grade 3 IDC with DCIS plus LCIS ER 100%, PR 2%, HER-2 negative ratio 1.45, Ki-67 25%, T1 BN 0 stage IA clinical stage      MEDICAL HISTORY:  Past Medical History:  Diagnosis Date  . Osteopenia     SURGICAL HISTORY: Past Surgical History:  Procedure Laterality Date  . ROTATOR CUFF REPAIR    . TONSILLECTOMY      SOCIAL HISTORY: Social History   Social History  . Marital status: Married    Spouse name: N/A  . Number of children: N/A  . Years of education: N/A   Occupational History  . Not on file.   Social History Main Topics  . Smoking status: Former Smoker    Quit date: 05/11/1975  . Smokeless tobacco: Not on file  . Alcohol use 3.0 oz/week    5 Glasses of  wine per week  . Drug use: No  . Sexual activity: Not on file   Other Topics Concern  . Not on file   Social History Narrative  . No narrative on file    FAMILY HISTORY: Family History  Problem Relation Age of Onset  . Lung cancer Mother   . Cancer Mother 76       Lung  . Colon cancer Father   . Cancer Father 3       Stomach or Colon  . Colon cancer Maternal Grandmother   . Cancer Maternal Grandmother 60       Colon  . Cancer Cousin 40       Colon    ALLERGIES:  has No Known Allergies.  MEDICATIONS:  Current Outpatient Prescriptions  Medication Sig Dispense Refill  . Bioflavonoid Products (ESTER-C) 500-200-60 MG TABS Take 1 tablet by mouth daily.    . Calcium 500-125 MG-UNIT TABS Take 2 tablets by mouth daily.    Marland Kitchen glucosamine-chondroitin 500-400 MG tablet Take 1 tablet by mouth daily.    Marland Kitchen levocetirizine (XYZAL) 5 MG tablet Take 1 tablet (5 mg total) by mouth every evening. 30 tablet 12  . levothyroxine (SYNTHROID, LEVOTHROID) 88 MCG tablet Take 88 mcg by mouth daily before breakfast.    . Multiple Vitamin (MULTIVITAMIN) tablet Take 1 tablet by mouth daily.    . NON FORMULARY Take 1 tablet by mouth daily. DIGEST ZEN TABLET    . NON FORMULARY Take 1 tablet by mouth daily. phytoestrogen for  hot flashes    . cefdinir (OMNICEF) 300 MG capsule Take 1 capsule (300 mg total) by mouth 2 (two) times daily. (Patient not taking: Reported on 05/10/2017) 20 capsule 0  . predniSONE (DELTASONE) 10 MG tablet Take 3 tablets (30 mg total) by mouth daily with breakfast. (Patient not taking: Reported on 05/10/2017) 9 tablet 0   No current facility-administered medications for this visit.     REVIEW OF SYSTEMS:   Constitutional: Denies fevers, chills or abnormal night sweats Eyes: Denies blurriness of vision, double vision or watery eyes Ears, nose, mouth, throat, and face: Denies mucositis or sore throat Respiratory: Denies cough, dyspnea or wheezes Cardiovascular: Denies palpitation,  chest discomfort or lower extremity swelling Gastrointestinal:  Denies nausea, heartburn or change in bowel habits Skin: Denies abnormal skin rashes Lymphatics: Denies new lymphadenopathy or easy bruising Neurological:Denies numbness, tingling or new weaknesses Behavioral/Psych: Mood is stable, no new changes  Breast:  Denies any palpable lumps or discharge All other systems were reviewed with the patient and are negative.  PHYSICAL EXAMINATION: ECOG PERFORMANCE STATUS: 0 - Asymptomatic  Vitals:   05/10/17 1255  BP: (!) 131/48  Pulse: 68  Resp: 18  Temp: 97.5 F (36.4 C)   Filed Weights   05/10/17 1255  Weight: 156 lb 1.6 oz (70.8 kg)    GENERAL:alert, no distress and comfortable SKIN: skin color, texture, turgor are normal, no rashes or significant lesions EYES: normal, conjunctiva are pink and non-injected, sclera clear OROPHARYNX:no exudate, no erythema and lips, buccal mucosa, and tongue normal  NECK: supple, thyroid normal size, non-tender, without nodularity LYMPH:  no palpable lymphadenopathy in the cervical, axillary or inguinal LUNGS: clear to auscultation and percussion with normal breathing effort HEART: regular rate & rhythm and no murmurs and no lower extremity edema ABDOMEN:abdomen soft, non-tender and normal bowel sounds Musculoskeletal:no cyanosis of digits and no clubbing  PSYCH: alert & oriented x 3 with fluent speech NEURO: no focal motor/sensory deficits BREAST: No palpable nodules in breast. No palpable axillary or supraclavicular lymphadenopathy (exam performed in the presence of a chaperone)   LABORATORY DATA:  I have reviewed the data as listed Lab Results  Component Value Date   WBC 4.6 05/10/2017   HGB 13.7 05/10/2017   HCT 40.8 05/10/2017   MCV 90.5 05/10/2017   PLT 229 05/10/2017   Lab Results  Component Value Date   NA 140 05/10/2017   K 4.2 05/10/2017   CO2 28 05/10/2017    RADIOGRAPHIC STUDIES: I have personally reviewed the  radiological reports and agreed with the findings in the report.  ASSESSMENT AND PLAN:  Malignant neoplasm of upper-outer quadrant of right breast in female, estrogen receptor positive ([facility]) 05/03/2017: Screening detected right breast calcifications 7 mm, right axilla negative, tumor biopsy grade 3 IDC with DCIS plus LCIS ER 100%, PR 2%, HER-2 negative ratio 1.45, Ki-67 25%, T1 BN 0 stage IA clinical stage  Pathology and radiology counseling:Discussed with the patient, the details of pathology including the type of breast cancer,the clinical staging, the significance of ER, PR and HER-2/neu receptors and the implications for treatment. After reviewing the pathology in detail, we proceeded to discuss the different treatment options between surgery, radiation, chemotherapy, antiestrogen therapies.  Recommendations: 1. Breast conserving surgery followed by 2. Oncotype DX testing to determine if chemotherapy would be of any benefit followed by 3. Adjuvant radiation therapy followed by 4. Adjuvant antiestrogen therapy  Oncotype counseling: I discussed Oncotype DX test. I explained to the patient that this   is a 21 gene panel to evaluate patient tumors DNA to calculate recurrence score. This would help determine whether patient has high risk or intermediate risk or low risk breast cancer. She understands that if her tumor was found to be high risk, she would benefit from systemic chemotherapy. If low risk, no need of chemotherapy. If she was found to be intermediate risk, we would need to evaluate the score as well as other risk factors and determine if an abbreviated chemotherapy may be of benefit.  Return to clinic after surgery to discuss final pathology report and then determine if Oncotype DX testing will need to be sent.     All questions were answered. The patient knows to call the clinic with any problems, questions or concerns.    ,  K, MD 05/10/17  

## 2017-05-11 ENCOUNTER — Encounter: Payer: Self-pay | Admitting: General Practice

## 2017-05-11 NOTE — Progress Notes (Signed)
[facility] Psychosocial Distress Screening Spiritual Care  Met with Diana Jacobson and her husband in [facility] to introduce [facility] team/resources, reviewing distress screen per protocol.  The patient scored a 5 on the Psychosocial Distress Thermometer which indicates moderate distress. Also assessed for distress and other psychosocial needs.   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 She and her husband report good support.  We talked about not being able to counsel oneself and the importance of having a wider network.  Per pt, her biggest stressor right now is fielding insensitive/unconstructive comments from others; she is mindful of setting very clear boundaries about what she wants and does not want to hear/discuss.  Diana Jacobson is interested in support programming and will consider options in [place] (where she lives) and [place] (where she works). She knows to reach out with any questions or concerns.  Follow up needed: No. Per couple, no other needs at this time.  They have full packet of support center team/resource info, but please also page if immediate needs arise. Thank you.   [place], [place], [facility] Pager [phone] Voicemail [phone]

## 2017-05-17 ENCOUNTER — Telehealth: Payer: Self-pay | Admitting: Hematology and Oncology

## 2017-05-17 ENCOUNTER — Telehealth: Payer: Self-pay | Admitting: *Deleted

## 2017-05-17 NOTE — Telephone Encounter (Signed)
lvm to inform pt of 6/25 appt at 1400 per sch msg

## 2017-05-17 NOTE — Telephone Encounter (Signed)
  Oncology Nurse Navigator Documentation  Navigator Location: [facility] (05/17/17 1400)   )Navigator Encounter Type: Telephone (05/17/17 1400) Telephone: Outgoing Call;Clinic/[facility]C Follow-up (05/17/17 1400)     Surgery Date: 05/29/17 (05/17/17 1400)           Treatment Initiated Date: 05/29/17 (05/17/17 1400)                                Time Spent with Patient: 15 (05/17/17 1400)

## 2017-05-22 ENCOUNTER — Encounter (HOSPITAL_BASED_OUTPATIENT_CLINIC_OR_DEPARTMENT_OTHER): Payer: Self-pay | Admitting: *Deleted

## 2017-05-25 DIAGNOSIS — C50411 Malignant neoplasm of upper-outer quadrant of right female breast: Secondary | ICD-10-CM | POA: Diagnosis not present

## 2017-05-25 NOTE — Progress Notes (Signed)
Pt in to pick up her Boost Breeze, instructions reviewed.

## 2017-05-29 ENCOUNTER — Encounter (HOSPITAL_BASED_OUTPATIENT_CLINIC_OR_DEPARTMENT_OTHER): Payer: Self-pay | Admitting: Anesthesiology

## 2017-05-29 ENCOUNTER — Ambulatory Visit (HOSPITAL_COMMUNITY)
Admission: RE | Admit: 2017-05-29 | Discharge: 2017-05-29 | Disposition: A | Discharge status: Home / Self Care | Payer: 59 | Source: Ambulatory Visit | Attending: General Surgery | Admitting: General Surgery

## 2017-05-29 ENCOUNTER — Ambulatory Visit (HOSPITAL_BASED_OUTPATIENT_CLINIC_OR_DEPARTMENT_OTHER): Payer: 59 | Admitting: Anesthesiology

## 2017-05-29 ENCOUNTER — Ambulatory Visit (HOSPITAL_BASED_OUTPATIENT_CLINIC_OR_DEPARTMENT_OTHER)
Admission: RE | Admit: 2017-05-29 | Discharge: 2017-05-29 | Disposition: A | Discharge status: Home / Self Care | Payer: 59 | Source: Ambulatory Visit | Attending: General Surgery | Admitting: General Surgery

## 2017-05-29 ENCOUNTER — Encounter (HOSPITAL_BASED_OUTPATIENT_CLINIC_OR_DEPARTMENT_OTHER)
Admission: RE | Disposition: A | Discharge status: Home / Self Care | Payer: Self-pay | Source: Ambulatory Visit | Attending: General Surgery

## 2017-05-29 DIAGNOSIS — Z803 Family history of malignant neoplasm of breast: Secondary | ICD-10-CM | POA: Diagnosis not present

## 2017-05-29 DIAGNOSIS — M8580 Other specified disorders of bone density and structure, unspecified site: Secondary | ICD-10-CM | POA: Diagnosis not present

## 2017-05-29 DIAGNOSIS — Z9889 Other specified postprocedural states: Secondary | ICD-10-CM | POA: Insufficient documentation

## 2017-05-29 DIAGNOSIS — C50511 Malignant neoplasm of lower-outer quadrant of right female breast: Secondary | ICD-10-CM

## 2017-05-29 DIAGNOSIS — E039 Hypothyroidism, unspecified: Secondary | ICD-10-CM | POA: Diagnosis not present

## 2017-05-29 DIAGNOSIS — F419 Anxiety disorder, unspecified: Secondary | ICD-10-CM | POA: Diagnosis not present

## 2017-05-29 DIAGNOSIS — Z811 Family history of alcohol abuse and dependence: Secondary | ICD-10-CM | POA: Insufficient documentation

## 2017-05-29 DIAGNOSIS — Z87891 Personal history of nicotine dependence: Secondary | ICD-10-CM | POA: Insufficient documentation

## 2017-05-29 DIAGNOSIS — D0511 Intraductal carcinoma in situ of right breast: Secondary | ICD-10-CM | POA: Insufficient documentation

## 2017-05-29 DIAGNOSIS — Z8261 Family history of arthritis: Secondary | ICD-10-CM | POA: Insufficient documentation

## 2017-05-29 DIAGNOSIS — K219 Gastro-esophageal reflux disease without esophagitis: Secondary | ICD-10-CM | POA: Diagnosis not present

## 2017-05-29 DIAGNOSIS — F329 Major depressive disorder, single episode, unspecified: Secondary | ICD-10-CM | POA: Insufficient documentation

## 2017-05-29 DIAGNOSIS — K649 Unspecified hemorrhoids: Secondary | ICD-10-CM | POA: Diagnosis not present

## 2017-05-29 DIAGNOSIS — M1990 Unspecified osteoarthritis, unspecified site: Secondary | ICD-10-CM | POA: Insufficient documentation

## 2017-05-29 DIAGNOSIS — Z8349 Family history of other endocrine, nutritional and metabolic diseases: Secondary | ICD-10-CM | POA: Insufficient documentation

## 2017-05-29 DIAGNOSIS — Z800 Family history of malignant neoplasm of digestive organs: Secondary | ICD-10-CM | POA: Diagnosis not present

## 2017-05-29 DIAGNOSIS — C50911 Malignant neoplasm of unspecified site of right female breast: Secondary | ICD-10-CM | POA: Diagnosis not present

## 2017-05-29 DIAGNOSIS — Z170 Estrogen receptor positive status [ER+]: Principal | ICD-10-CM

## 2017-05-29 DIAGNOSIS — G8918 Other acute postprocedural pain: Secondary | ICD-10-CM | POA: Diagnosis not present

## 2017-05-29 DIAGNOSIS — Z8249 Family history of ischemic heart disease and other diseases of the circulatory system: Secondary | ICD-10-CM | POA: Insufficient documentation

## 2017-05-29 DIAGNOSIS — Z833 Family history of diabetes mellitus: Secondary | ICD-10-CM | POA: Diagnosis not present

## 2017-05-29 DIAGNOSIS — Z836 Family history of other diseases of the respiratory system: Secondary | ICD-10-CM | POA: Insufficient documentation

## 2017-05-29 DIAGNOSIS — C50411 Malignant neoplasm of upper-outer quadrant of right female breast: Secondary | ICD-10-CM | POA: Diagnosis not present

## 2017-05-29 HISTORY — PX: BREAST LUMPECTOMY: SHX2

## 2017-05-29 HISTORY — DX: Gastro-esophageal reflux disease without esophagitis: K21.9

## 2017-05-29 HISTORY — PX: BREAST LUMPECTOMY WITH RADIOACTIVE SEED AND SENTINEL LYMPH NODE BIOPSY: SHX6550

## 2017-05-29 HISTORY — DX: Hypothyroidism, unspecified: E03.9

## 2017-05-29 SURGERY — BREAST LUMPECTOMY WITH RADIOACTIVE SEED AND SENTINEL LYMPH NODE BIOPSY
Anesthesia: General | Site: Breast | Laterality: Right

## 2017-05-29 MED ORDER — LIDOCAINE 2% (20 MG/ML) 5 ML SYRINGE
INTRAMUSCULAR | Status: AC
  Filled 2017-05-29: qty 5

## 2017-05-29 MED ORDER — METOCLOPRAMIDE HCL 5 MG/ML IJ SOLN: 10.0000 mg | Freq: Once | INTRAMUSCULAR | Status: DC | PRN

## 2017-05-29 MED ORDER — GABAPENTIN 300 MG PO CAPS
ORAL_CAPSULE | ORAL | Status: AC
  Filled 2017-05-29: qty 1

## 2017-05-29 MED ORDER — GABAPENTIN 300 MG PO CAPS
300.0000 mg | ORAL_CAPSULE | ORAL | Status: AC
  Administered 2017-05-29: 300 mg via ORAL

## 2017-05-29 MED ORDER — MIDAZOLAM HCL 2 MG/2ML IJ SOLN
INTRAMUSCULAR | Status: AC
  Filled 2017-05-29: qty 2

## 2017-05-29 MED ORDER — ACETAMINOPHEN 500 MG PO TABS
1000.0000 mg | ORAL_TABLET | ORAL | Status: AC
  Administered 2017-05-29: 1000 mg via ORAL

## 2017-05-29 MED ORDER — CEFAZOLIN SODIUM-DEXTROSE 2-4 GM/100ML-% IV SOLN
INTRAVENOUS | Status: AC
  Filled 2017-05-29: qty 100

## 2017-05-29 MED ORDER — SCOPOLAMINE 1 MG/3DAYS TD PT72: 1.0000 | MEDICATED_PATCH | Freq: Once | TRANSDERMAL | Status: DC | PRN

## 2017-05-29 MED ORDER — PROPOFOL 10 MG/ML IV BOLUS
INTRAVENOUS | Status: DC | PRN
  Administered 2017-05-29: 150 mg via INTRAVENOUS

## 2017-05-29 MED ORDER — ACETAMINOPHEN 500 MG PO TABS
ORAL_TABLET | ORAL | Status: AC
  Filled 2017-05-29: qty 2

## 2017-05-29 MED ORDER — FENTANYL CITRATE (PF) 100 MCG/2ML IJ SOLN: 25.0000 ug | INTRAMUSCULAR | Status: DC | PRN

## 2017-05-29 MED ORDER — HYDROCODONE-ACETAMINOPHEN 7.5-325 MG PO TABS: 1.0000 | ORAL_TABLET | Freq: Once | ORAL | Status: DC | PRN

## 2017-05-29 MED ORDER — PROPOFOL 10 MG/ML IV BOLUS
INTRAVENOUS | Status: AC
  Filled 2017-05-29: qty 20

## 2017-05-29 MED ORDER — MEPERIDINE HCL 25 MG/ML IJ SOLN: 6.2500 mg | INTRAMUSCULAR | Status: DC | PRN

## 2017-05-29 MED ORDER — CHLORHEXIDINE GLUCONATE CLOTH 2 % EX PADS: 6.0000 | MEDICATED_PAD | Freq: Once | CUTANEOUS | Status: DC

## 2017-05-29 MED ORDER — MIDAZOLAM HCL 2 MG/2ML IJ SOLN
1.0000 mg | INTRAMUSCULAR | Status: DC | PRN
  Administered 2017-05-29: 2 mg via INTRAVENOUS

## 2017-05-29 MED ORDER — FENTANYL CITRATE (PF) 100 MCG/2ML IJ SOLN
50.0000 ug | INTRAMUSCULAR | Status: AC | PRN
  Administered 2017-05-29: 50 ug via INTRAVENOUS
  Administered 2017-05-29 (×3): 25 ug via INTRAVENOUS
  Administered 2017-05-29: 50 ug via INTRAVENOUS
  Administered 2017-05-29: 25 ug via INTRAVENOUS

## 2017-05-29 MED ORDER — PHENYLEPHRINE 40 MCG/ML (10ML) SYRINGE FOR IV PUSH (FOR BLOOD PRESSURE SUPPORT)
PREFILLED_SYRINGE | INTRAVENOUS | Status: AC
  Filled 2017-05-29: qty 10

## 2017-05-29 MED ORDER — CEFAZOLIN SODIUM-DEXTROSE 2-4 GM/100ML-% IV SOLN
2.0000 g | INTRAVENOUS | Status: AC
  Administered 2017-05-29: 2 g via INTRAVENOUS

## 2017-05-29 MED ORDER — BUPIVACAINE HCL (PF) 0.25 % IJ SOLN
INTRAMUSCULAR | Status: DC | PRN
  Administered 2017-05-29: 11 mL

## 2017-05-29 MED ORDER — ONDANSETRON HCL 4 MG/2ML IJ SOLN
INTRAMUSCULAR | Status: DC | PRN
  Administered 2017-05-29: 4 mg via INTRAVENOUS

## 2017-05-29 MED ORDER — FENTANYL CITRATE (PF) 100 MCG/2ML IJ SOLN
INTRAMUSCULAR | Status: AC
  Filled 2017-05-29: qty 2

## 2017-05-29 MED ORDER — DEXAMETHASONE SODIUM PHOSPHATE 10 MG/ML IJ SOLN
INTRAMUSCULAR | Status: AC
  Filled 2017-05-29: qty 1

## 2017-05-29 MED ORDER — LIDOCAINE 2% (20 MG/ML) 5 ML SYRINGE
INTRAMUSCULAR | Status: DC | PRN
  Administered 2017-05-29: 60 mg via INTRAVENOUS

## 2017-05-29 MED ORDER — ONDANSETRON HCL 4 MG/2ML IJ SOLN
INTRAMUSCULAR | Status: AC
  Filled 2017-05-29: qty 2

## 2017-05-29 MED ORDER — OXYCODONE-ACETAMINOPHEN 5-325 MG PO TABS: 1.0000 | ORAL_TABLET | Freq: Four times a day (QID) | ORAL | 0 refills | Status: DC | PRN

## 2017-05-29 MED ORDER — TECHNETIUM TC 99M SULFUR COLLOID FILTERED
1.0000 | Freq: Once | INTRAVENOUS | Status: AC | PRN
  Administered 2017-05-29: 1 via INTRADERMAL

## 2017-05-29 MED ORDER — BUPIVACAINE-EPINEPHRINE (PF) 0.5% -1:200000 IJ SOLN
INTRAMUSCULAR | Status: DC | PRN
  Administered 2017-05-29: 30 mL via PERINEURAL

## 2017-05-29 MED ORDER — LACTATED RINGERS IV SOLN
INTRAVENOUS | Status: DC
  Administered 2017-05-29 (×3): via INTRAVENOUS

## 2017-05-29 MED ORDER — DEXAMETHASONE SODIUM PHOSPHATE 10 MG/ML IJ SOLN
INTRAMUSCULAR | Status: DC | PRN
  Administered 2017-05-29: 10 mg via INTRAVENOUS

## 2017-05-29 MED ORDER — PHENYLEPHRINE HCL 10 MG/ML IJ SOLN
INTRAMUSCULAR | Status: DC | PRN
  Administered 2017-05-29 (×2): 40 ug via INTRAVENOUS

## 2017-05-29 MED FILL — OXYCODONE-ACETAMINOPHEN 5-3: 5-325 | 4 days supply | Qty: 15 | Fill #0

## 2017-05-29 SURGICAL SUPPLY — 59 items
ADH SKN CLS APL DERMABOND .7 (GAUZE/BANDAGES/DRESSINGS) ×1
BINDER BREAST LRG (GAUZE/BANDAGES/DRESSINGS) ×1 IMPLANT
BINDER BREAST MEDIUM (GAUZE/BANDAGES/DRESSINGS) IMPLANT
BINDER BREAST XLRG (GAUZE/BANDAGES/DRESSINGS) IMPLANT
BINDER BREAST XXLRG (GAUZE/BANDAGES/DRESSINGS) IMPLANT
BLADE SURG 15 STRL LF DISP TIS (BLADE) ×1 IMPLANT
BLADE SURG 15 STRL SS (BLADE) ×2
CANISTER SUC SOCK COL 7IN (MISCELLANEOUS) IMPLANT
CANISTER SUCT 1200ML W/VALVE (MISCELLANEOUS) IMPLANT
CHLORAPREP W/TINT 26ML (MISCELLANEOUS) ×2 IMPLANT
CLIP TI WIDE RED SMALL 6 (CLIP) ×2 IMPLANT
COVER BACK TABLE 60X90IN (DRAPES) ×2 IMPLANT
COVER MAYO STAND STRL (DRAPES) ×2 IMPLANT
COVER PROBE W GEL 5X96 (DRAPES) ×2 IMPLANT
DECANTER SPIKE VIAL GLASS SM (MISCELLANEOUS) IMPLANT
DERMABOND ADVANCED (GAUZE/BANDAGES/DRESSINGS) ×1
DERMABOND ADVANCED .7 DNX12 (GAUZE/BANDAGES/DRESSINGS) ×1 IMPLANT
DEVICE DUBIN W/COMP PLATE 8390 (MISCELLANEOUS) ×2 IMPLANT
DRAPE LAPAROSCOPIC ABDOMINAL (DRAPES) ×2 IMPLANT
DRAPE UTILITY XL STRL (DRAPES) ×2 IMPLANT
ELECT COATED BLADE 2.86 ST (ELECTRODE) ×2 IMPLANT
ELECT REM PT RETURN 9FT ADLT (ELECTROSURGICAL) ×2
ELECTRODE REM PT RTRN 9FT ADLT (ELECTROSURGICAL) ×1 IMPLANT
GLOVE BIO SURGEON STRL SZ7 (GLOVE) ×4 IMPLANT
GLOVE BIOGEL PI IND STRL 7.0 (GLOVE) IMPLANT
GLOVE BIOGEL PI IND STRL 7.5 (GLOVE) ×1 IMPLANT
GLOVE BIOGEL PI INDICATOR 7.0 (GLOVE) ×2
GLOVE BIOGEL PI INDICATOR 7.5 (GLOVE) ×1
GLOVE SURG SS PI 6.5 STRL IVOR (GLOVE) ×1 IMPLANT
GOWN STRL REUS W/ TWL LRG LVL3 (GOWN DISPOSABLE) ×2 IMPLANT
GOWN STRL REUS W/TWL LRG LVL3 (GOWN DISPOSABLE) ×4
HEMOSTAT ARISTA ABSORB 3G PWDR (MISCELLANEOUS) IMPLANT
ILLUMINATOR WAVEGUIDE N/F (MISCELLANEOUS) ×1 IMPLANT
KIT MARKER MARGIN INK (KITS) ×2 IMPLANT
LIGHT WAVEGUIDE WIDE FLAT (MISCELLANEOUS) IMPLANT
NDL HYPO 25X1 1.5 SAFETY (NEEDLE) ×1 IMPLANT
NDL SAFETY ECLIPSE 18X1.5 (NEEDLE) IMPLANT
NEEDLE HYPO 18GX1.5 SHARP (NEEDLE)
NEEDLE HYPO 25X1 1.5 SAFETY (NEEDLE) ×2 IMPLANT
NS IRRIG 1000ML POUR BTL (IV SOLUTION) IMPLANT
PACK BASIN DAY SURGERY FS (CUSTOM PROCEDURE TRAY) ×2 IMPLANT
PENCIL BUTTON HOLSTER BLD 10FT (ELECTRODE) ×2 IMPLANT
SLEEVE SCD COMPRESS KNEE MED (MISCELLANEOUS) ×2 IMPLANT
SPONGE LAP 4X18 X RAY DECT (DISPOSABLE) ×3 IMPLANT
STRIP CLOSURE SKIN 1/2X4 (GAUZE/BANDAGES/DRESSINGS) ×2 IMPLANT
SUT ETHILON 2 0 FS 18 (SUTURE) IMPLANT
SUT MNCRL AB 4-0 PS2 18 (SUTURE) ×2 IMPLANT
SUT MON AB 5-0 PS2 18 (SUTURE) ×1 IMPLANT
SUT SILK 2 0 SH (SUTURE) IMPLANT
SUT VIC AB 2-0 SH 27 (SUTURE) ×4
SUT VIC AB 2-0 SH 27XBRD (SUTURE) ×1 IMPLANT
SUT VIC AB 3-0 SH 27 (SUTURE) ×4
SUT VIC AB 3-0 SH 27X BRD (SUTURE) ×1 IMPLANT
SUT VIC AB 5-0 PS2 18 (SUTURE) IMPLANT
SYR CONTROL 10ML LL (SYRINGE) ×2 IMPLANT
TOWEL OR 17X24 6PK STRL BLUE (TOWEL DISPOSABLE) ×2 IMPLANT
TOWEL OR NON WOVEN STRL DISP B (DISPOSABLE) ×2 IMPLANT
TUBE CONNECTING 20X1/4 (TUBING) IMPLANT
YANKAUER SUCT BULB TIP NO VENT (SUCTIONS) IMPLANT

## 2017-05-29 NOTE — Transfer of Care (Signed)
Immediate Anesthesia Transfer of Care Note  Patient: Diana Jacobson  Procedure(s) Performed: Procedure(s): BREAST LUMPECTOMY WITH RADIOACTIVE SEED AND SENTINEL LYMPH NODE BIOPSY (Right)  Patient Location: PACU  Anesthesia Type:General  Level of Consciousness: awake, alert , oriented and patient cooperative  Airway & Oxygen Therapy: Patient Spontanous Breathing and Patient connected to face mask oxygen  Post-op Assessment: Report given to RN and Post -op Vital signs reviewed and stable  Post vital signs: Reviewed and stable  Last Vitals:  Vitals:   05/29/17 1105 05/29/17 1115  BP:  112/62  Pulse: 74 73  Resp: 15 20  Temp:      Last Pain:  Vitals:   05/29/17 1022  TempSrc: Oral         Complications: No apparent anesthesia complications

## 2017-05-29 NOTE — Progress Notes (Signed)
Patient ID: Diana Jacobson, female   DOB: 1956/06/18, 61 y.o.   MRN: [id number] Reviewed nccsr day of surgery, postop pain med given

## 2017-05-29 NOTE — Op Note (Signed)
Preoperative diagnosis: Rightbreast cancer, clinical stage I Postoperative diagnosis: same as above Procedure: 1. Rightbreast seed guided lumpectomy 2. Right deep axillary sentinel node biopsy Surgeon Dr Serita Grammes Anes general  EBL: minimal Comps none Specimen:  1. Right breast marked with paint containing clip and seeds 2. Rightaxillary sentinel nodes with highest count 232 Sponge count correct at completion Dispoto recovery stable  Indications: This is a 4 yof with newly diagnosed right breast IDC. We have decided to proceed with lumpectomy/sn biopsy.She had a seed placed prior to beginning but one migrated so another was placed at site of cancer.  I had these mm in the OR.   Procedure: After informed consent was obtained the patient was taken to the OR. She was injected with technetium in the standard periareolar fashion.She was given anitibiotics. SCDs were in place. She was prepped and draped in the standard sterile surgical fashion. A timeout was performed. She had undergone a pectoral block.  I then located the seed in the lateral breast. I infiltrated marcaine around the areola. I then made a periareolar incision to hide the scar.  I then used lighted retractors to tunnel to the lesion. I then removed the seedswith an attempt to get a clear margin. This waspassed off the table after marking with paint.I did confirm removal of theclipand seeds with mammography. I obtained hemostasis. I placed clips in this cavity. The breast tissue was approximated with 2-0 vicryl.  I then closed with 2-0 vicryl, 3-0 vicryl and 5-0 monocryl. Glue and steristrips were applied. I then made an incision below the axillary hairline. I then opened the axillary fascia. I identified the sentinel nodes with the highest count as above. There was no gross nodal disease.There was no background radioactivity. I obtained hemostasis. I closed the axillary fascia and breast tissue with 2-0  vicryl. I then closed the dermis with 3-0 vicryl and the skin with 4-0 monocryl.  Glue and steristrips were placed.A breast binder was placed. She was extubated and transferred to recovery stable

## 2017-05-29 NOTE — Interval H&P Note (Signed)
History and Physical Interval Note:  05/29/2017 11:17 AM  Diana Jacobson  has presented today for surgery, with the diagnosis of RIGHT BREAST CANCER  The various methods of treatment have been discussed with the patient and family. After consideration of risks, benefits and other options for treatment, the patient has consented to  Procedure(s): BREAST LUMPECTOMY WITH RADIOACTIVE SEED AND SENTINEL LYMPH NODE BIOPSY (Right) as a surgical intervention .  The patient's history has been reviewed, patient examined, no change in status, stable for surgery.  I have reviewed the patient's chart and labs.  Questions were answered to the patient's satisfaction.     Rethel Sebek

## 2017-05-29 NOTE — Progress Notes (Signed)
Assisted Dr. Foster with right, ultrasound guided, pectoralis block. Side rails up, monitors on throughout procedure. See vital signs in flow sheet. Tolerated Procedure well. 

## 2017-05-29 NOTE — Anesthesia Preprocedure Evaluation (Signed)
Anesthesia Evaluation  Patient identified by MRN, date of birth, ID band Patient awake    Reviewed: Allergy & Precautions, NPO status , Patient's Chart, lab work & pertinent test results  Airway Mallampati: II  TM Distance: >3 FB Neck ROM: Full    Dental no notable dental hx. (+) Teeth Intact, Caps   Pulmonary former smoker,    Pulmonary exam normal breath sounds clear to auscultation       Cardiovascular Normal cardiovascular exam Rhythm:Regular Rate:Normal     Neuro/Psych negative neurological ROS  negative psych ROS   GI/Hepatic Neg liver ROS, GERD  ,  Endo/Other  Hypothyroidism Right breast Ca  Renal/GU negative Renal ROS  negative genitourinary   Musculoskeletal Osteopenia   Abdominal   Peds  Hematology negative hematology ROS (+)   Anesthesia Other Findings   Reproductive/Obstetrics                             Anesthesia Physical Anesthesia Plan  ASA: II  Anesthesia Plan: General   Post-op Pain Management:  Regional for Post-op pain   Induction: Intravenous  PONV Risk Score and Plan: 4 or greater and Ondansetron, Dexamethasone, Propofol, Midazolam and Scopolamine patch - Pre-op  Airway Management Planned: LMA  Additional Equipment:   Intra-op Plan:   Post-operative Plan: Extubation in OR  Informed Consent: I have reviewed the patients History and Physical, chart, labs and discussed the procedure including the risks, benefits and alternatives for the proposed anesthesia with the patient or authorized representative who has indicated his/her understanding and acceptance.   Dental advisory given  Plan Discussed with: Surgeon, CRNA and Anesthesiologist  Anesthesia Plan Comments:         Anesthesia Quick Evaluation

## 2017-05-29 NOTE — Discharge Instructions (Signed)
[facility] °Office Phone Number [phone] ° °POST OP INSTRUCTIONS ° °Always review your discharge instruction sheet given to you by the facility where your surgery was performed. ° °IF YOU HAVE DISABILITY OR FAMILY LEAVE FORMS, YOU MUST BRING THEM TO THE OFFICE FOR PROCESSING.  DO NOT GIVE THEM TO YOUR DOCTOR. ° °1. A prescription for pain medication may be given to you upon discharge.  Take your pain medication as prescribed, if needed.  If narcotic pain medicine is not needed, then you may take acetaminophen (Tylenol), naprosyn (Alleve) or ibuprofen (Advil) as needed. °2. Take your usually prescribed medications unless otherwise directed °3. If you need a refill on your pain medication, please contact your pharmacy.  They will contact our office to request authorization.  Prescriptions will not be filled after 5pm or on week-ends. °4. You should eat very light the first 24 hours after surgery, such as soup, crackers, pudding, etc.  Resume your normal diet the day after surgery. °5. Most patients will experience some swelling and bruising in the breast.  Ice packs and a good support bra will help.  Wear the breast binder provided or a sports bra for 72 hours day and night.  After that wear a sports bra during the day until you return to the office. Swelling and bruising can take several days to resolve.  °6. It is common to experience some constipation if taking pain medication after surgery.  Increasing fluid intake and taking a stool softener will usually help or prevent this problem from occurring.  A mild laxative (Milk of Magnesia or Miralax) should be taken according to package directions if there are no bowel movements after 48 hours. °7. Unless discharge instructions indicate otherwise, you may remove your bandages 48 hours after surgery and you may shower at that time.  You may have steri-strips (small skin tapes) in place directly over the incision.  These strips should be left on the  skin for 7-10 days and will come off on their own.  If your surgeon used skin glue on the incision, you may shower in 24 hours.  The glue will flake off over the next 2-3 weeks.  Any sutures or staples will be removed at the office during your follow-up visit. °8. ACTIVITIES:  You may resume regular daily activities (gradually increasing) beginning the next day.  Wearing a good support bra or sports bra minimizes pain and swelling.  You may have sexual intercourse when it is comfortable. °a. You may drive when you no longer are taking prescription pain medication, you can comfortably wear a seatbelt, and you can safely maneuver your car and apply brakes. °b. RETURN TO WORK:  ______________________________________________________________________________________ °9. You should see your doctor in the office for a follow-up appointment approximately two weeks after your surgery.  Your doctor’s nurse will typically make your follow-up appointment when she calls you with your pathology report.  Expect your pathology report 3-4 business days after your surgery.  You may call to check if you do not hear from us after three days. °10. OTHER INSTRUCTIONS: _______________________________________________________________________________________________ _____________________________________________________________________________________________________________________________________ °_____________________________________________________________________________________________________________________________________ °_____________________________________________________________________________________________________________________________________ ° °WHEN TO CALL DR WAKEFIELD: °1. Fever over 101.0 °2. Nausea and/or vomiting. °3. Extreme swelling or bruising. °4. Continued bleeding from incision. °5. Increased pain, redness, or drainage from the incision. ° °The clinic staff is available to answer your questions during regular  business hours.  Please don’t hesitate to call and ask to speak to one of the nurses for clinical concerns.  If   you have a medical emergency, go to the nearest emergency room or call 911.  A surgeon from [facility] is always on call at the hospital. ° °For further questions, please visit [email or web address] ° ° ° ° ° °Post Anesthesia Home Care Instructions ° °Activity: °Get plenty of rest for the remainder of the day. A responsible individual must stay with you for 24 hours following the procedure.  °For the next 24 hours, DO NOT: °-Drive a car °-Operate machinery °-Drink alcoholic beverages °-Take any medication unless instructed by your physician °-Make any legal decisions or sign important papers. ° °Meals: °Start with liquid foods such as gelatin or soup. Progress to regular foods as tolerated. Avoid greasy, spicy, heavy foods. If nausea and/or vomiting occur, drink only clear liquids until the nausea and/or vomiting subsides. Call your physician if vomiting continues. ° °Special Instructions/Symptoms: °Your throat may feel dry or sore from the anesthesia or the breathing tube placed in your throat during surgery. If this causes discomfort, gargle with warm salt water. The discomfort should disappear within 24 hours. ° °If you had a scopolamine patch placed behind your ear for the management of post- operative nausea and/or vomiting: ° °1. The medication in the patch is effective for 72 hours, after which it should be removed.  Wrap patch in a tissue and discard in the trash. Wash hands thoroughly with soap and water. °2. You may remove the patch earlier than 72 hours if you experience unpleasant side effects which may include dry mouth, dizziness or visual disturbances. °3. Avoid touching the patch. Wash your hands with soap and water after contact with the patch. °  ° °

## 2017-05-29 NOTE — Anesthesia Procedure Notes (Signed)
Procedure Name: LMA Insertion Date/Time: 05/29/2017 11:41 AM Performed by: Genelle Bal Pre-anesthesia Checklist: Patient identified, Emergency Drugs available, Suction available and Patient being monitored Patient Re-evaluated:Patient Re-evaluated prior to inductionOxygen Delivery Method: Circle system utilized Preoxygenation: Pre-oxygenation with 100% oxygen Intubation Type: IV induction Ventilation: Mask ventilation without difficulty LMA: LMA inserted LMA Size: 4.0 Number of attempts: 1 Placement Confirmation: positive ETCO2 and breath sounds checked- equal and bilateral Tube secured with: Tape Dental Injury: Teeth and Oropharynx as per pre-operative assessment

## 2017-05-29 NOTE — Anesthesia Postprocedure Evaluation (Signed)
Anesthesia Post Note  Patient: Diana Jacobson  Procedure(s) Performed: Procedure(s) (LRB): BREAST LUMPECTOMY WITH RADIOACTIVE SEED AND SENTINEL LYMPH NODE BIOPSY (Right)     Patient location during evaluation: PACU Anesthesia Type: General Level of consciousness: awake and alert and patient cooperative Pain management: pain level controlled Vital Signs Assessment: post-procedure vital signs reviewed and stable Respiratory status: spontaneous breathing and respiratory function stable Cardiovascular status: stable Anesthetic complications: no    Last Vitals:  Vitals:   05/29/17 1300 05/29/17 1315  BP:  124/69  Pulse: 64 62  Resp: (!) 9 14  Temp:      Last Pain:  Vitals:   05/29/17 1315  TempSrc:   PainSc: 0-No pain                 Delilah Mulgrew S

## 2017-05-29 NOTE — Anesthesia Procedure Notes (Addendum)
Anesthesia Regional Block: Pectoralis block   Pre-Anesthetic Checklist: ,, timeout performed, Correct Patient, Correct Site, Correct Laterality, Correct Procedure, Correct Position, site marked, Risks and benefits discussed,  Surgical consent,  Pre-op evaluation,  At surgeon's request and post-op pain management  Laterality: Right  Prep: chloraprep       Needles:  Injection technique: Single-shot  Needle Type: Echogenic Stimulator Needle     Needle Length: 9cm  Needle Gauge: 21   Needle insertion depth: 5 cm   Additional Needles:   Procedures: ultrasound guided,,,,,,,,  Narrative:  Start time: 05/29/2017 10:45 AM End time: 05/29/2017 10:51 AM  Performed by: Personally  Anesthesiologist: Josephine Igo  Additional Notes: Timeout performed. Patient sedated. Relevant anatomy ID'd using [place]. Incremental 2-74ml injection of LA with frequent aspiration. Patient tolerated procedure well.

## 2017-05-29 NOTE — Progress Notes (Signed)
Nuc med staff performed nuc med injection. Pt tol well with additional fentanyl. Will call family to bedside and update/provide emotional support.

## 2017-05-29 NOTE — H&P (Signed)
61 yof referred by Dr Dema Severin for newly diagnosed right breast cancer. she has family history in paternal cousin in 19s. she has prior abnl mm that was evaluated but nothing else came of that. she had no mass or dc. she underwent screening mm that shows b density breasts. she was found to have right breast calcifications measuring 7 mm on screening. these are located in the ruoq. her axillary [place] is negative. biopsy was performed that shows grade III IDC with dcis and lcis that is er/pr positive, her2 negative and Ki is 25%. she works for [employer] at [employer]. she is also a licensed psychotherapist.she is here with her husband to discuss options   Past Surgical History Conni Slipper, RN; 05/10/2017 7:28 AM) Breast Biopsy  Right. Shoulder Surgery  Right. Tonsillectomy   Diagnostic Studies History Conni Slipper, RN; 05/10/2017 7:28 AM) Colonoscopy  5-10 years ago Mammogram  within last year Pap Smear  1-5 years ago  Medication History Conni Slipper, RN; 05/10/2017 7:28 AM) Medications Reconciled  Social History Conni Slipper, RN; 05/10/2017 7:28 AM) Alcohol use  Moderate alcohol use. Caffeine use  Coffee. No drug use  Tobacco use  Former smoker.  Family History Conni Slipper, RN; 05/10/2017 7:28 AM) Alcohol Abuse  Brother. Arthritis  Family Members In General. Breast Cancer  Family Members In [place] Members In General, Father. Diabetes Mellitus  Daughter, Family Members In General, Father. Heart Disease  Family Members In General. Hypertension  Brother. Malignant Neoplasm Of Pancreas  Mother. Respiratory Condition  Mother. Thyroid problems  Family Members In General.  Pregnancy / Birth History Conni Slipper, RN; 05/10/2017 7:28 AM) Age at menarche  61 years. Age of menopause  51-55 Contraceptive History  Oral contraceptives. Gravida  4 Length (months) of breastfeeding  7-12 Maternal age  69-20 Para  5  Other Problems Conni Slipper, RN;  05/10/2017 7:28 AM) Anxiety Disorder  Arthritis  Depression  Gastroesophageal Reflux Disease  Hemorrhoids  Lump In Breast  Thyroid Disease   Review of Systems Conni Slipper RN; 05/10/2017 7:28 AM) General Not Present- Appetite Loss, Chills, Fatigue, Fever, Night Sweats, Weight Gain and Weight Loss. Skin Not Present- Change in Wart/Mole, Dryness, Hives, Jaundice, New Lesions, Non-Healing Wounds, Rash and Ulcer. HEENT Present- Seasonal Allergies. Not Present- Earache, Hearing Loss, Hoarseness, Nose Bleed, Oral Ulcers, Ringing in the Ears, Sinus Pain, Sore Throat, Visual Disturbances, Wears glasses/contact lenses and Yellow Eyes. Respiratory Not Present- Bloody sputum, Chronic Cough, Difficulty Breathing, Snoring and Wheezing. Breast Present- Breast Mass. Not Present- Breast Pain, Nipple Discharge and Skin Changes. Cardiovascular Not Present- Chest Pain, Difficulty Breathing Lying Down, Leg Cramps, Palpitations, Rapid Heart Rate, Shortness of Breath and Swelling of Extremities. Gastrointestinal Present- Hemorrhoids. Not Present- Abdominal Pain, Bloating, Bloody Stool, Change in Bowel Habits, Chronic diarrhea, Constipation, Difficulty Swallowing, Excessive gas, Gets full quickly at meals, Indigestion, Nausea, Rectal Pain and Vomiting. Female Genitourinary Not Present- Frequency, Nocturia, Painful Urination, Pelvic Pain and Urgency. Musculoskeletal Not Present- Back Pain, Joint Pain, Joint Stiffness, Muscle Pain, Muscle Weakness and Swelling of Extremities. Neurological Not Present- Decreased Memory, Fainting, Headaches, Numbness, Seizures, Tingling, Tremor, Trouble walking and Weakness. Psychiatric Not Present- Anxiety, Bipolar, Change in Sleep Pattern, Depression, Fearful and Frequent crying. Endocrine Not Present- Cold Intolerance, Excessive Hunger, Hair Changes, Heat Intolerance, Hot flashes and New Diabetes. Hematology Not Present- Blood Thinners, Easy Bruising, Excessive bleeding, Gland  problems, HIV and Persistent Infections.   Physical Exam Rolm Bookbinder MD; 05/11/2017 7:11 AM) General Mental Status-Alert. Orientation-Oriented X3.  Head and Neck Note: no thyromegaly Eye Sclera/Conjunctiva - Bilateral-No scleral icterus. Chest and Lung Exam Chest and lung exam reveals -quiet, even and easy respiratory effort with no use of accessory muscles and on auscultation, normal breath sounds, no adventitious sounds and normal vocal resonance. Breast Nipples-No Discharge. Breast Lump-No Palpable Breast Mass. Cardiovascular Cardiovascular examination reveals -normal heart sounds, regular rate and rhythm with no murmurs. Abdomen Note: soft nt Neurologic Note: alert and oriented times three, motor intact Lymphatic Head & Neck General Head & Neck Lymphatics: Bilateral - Description - Normal. Axillary General Axillary Region: Bilateral - Description - Normal. Note: no [place] adenopathy   Assessment & Plan Rolm Bookbinder MD; 05/11/2017 7:17 AM) BREAST CANCER OF UPPER-OUTER QUADRANT OF RIGHT FEMALE BREAST (C50.411) Story: Right breast seed guided lumpectomy, right axillary sentinel node biopsy We discussed the staging and pathophysiology of breast cancer. We discussed all of the different options for treatment for breast cancer including surgery, chemotherapy, radiation therapy, Herceptin, and antiestrogen therapy. We discussed a sentinel lymph node biopsy as she does not appear to having lymph node involvement right now. We discussed the performance of that with injection of radioactive tracer. We discussed that there is a chance of having a positive node with a sentinel lymph node biopsy. We discussed up to a 5% risk lifetime of chronic shoulder pain as well as lymphedema associated with a sentinel lymph node biopsy. We discussed the options for treatment of the breast cancer which included lumpectomy versus a mastectomy. We discussed the performance of the  lumpectomy with radioactive seed placement. We discussed a 5-10% chance of a positive margin requiring reexcision in the operating room. We also discussed that she will need radiation therapy if she undergoes lumpectomy. We discussed the mastectomy (removal of whole breast) and the postoperative care for that as well. Mastectomy can be followed by reconstruction. The decision for lumpectomy vs mastectomy has no impact on decision for chemotherapy. Most mastectomy patients will not need radiation therapy. We discussed that there is no difference in her survival whether she undergoes lumpectomy with radiation therapy or antiestrogen therapy versus a mastectomy. There is also no real difference between her recurrence in the breast. We discussed the risks of operation including bleeding, infection, possible reoperation. She understands her further therapy will be based on what her stages at the time of her operation

## 2017-05-30 ENCOUNTER — Encounter (HOSPITAL_BASED_OUTPATIENT_CLINIC_OR_DEPARTMENT_OTHER): Payer: Self-pay | Admitting: General Surgery

## 2017-06-01 ENCOUNTER — Telehealth: Payer: Self-pay | Admitting: *Deleted

## 2017-06-01 NOTE — Telephone Encounter (Signed)
Received order for oncotype testing. Requisition sent to pathology. Received by Keisha 

## 2017-06-06 MED FILL — LEVOCETIRIZINE 5 MG TABLET: 5 | 30 days supply | Qty: 30 | Fill #0

## 2017-06-08 DIAGNOSIS — Z170 Estrogen receptor positive status [ER+]: Secondary | ICD-10-CM | POA: Diagnosis not present

## 2017-06-08 DIAGNOSIS — C50411 Malignant neoplasm of upper-outer quadrant of right female breast: Secondary | ICD-10-CM | POA: Diagnosis not present

## 2017-06-12 ENCOUNTER — Encounter: Payer: Self-pay | Admitting: Hematology and Oncology

## 2017-06-12 ENCOUNTER — Ambulatory Visit (HOSPITAL_BASED_OUTPATIENT_CLINIC_OR_DEPARTMENT_OTHER): Payer: 59 | Admitting: Hematology and Oncology

## 2017-06-12 DIAGNOSIS — Z170 Estrogen receptor positive status [ER+]: Secondary | ICD-10-CM | POA: Diagnosis not present

## 2017-06-12 DIAGNOSIS — C50411 Malignant neoplasm of upper-outer quadrant of right female breast: Secondary | ICD-10-CM | POA: Diagnosis not present

## 2017-06-12 NOTE — Progress Notes (Signed)
Patient Care Team: Harlan Stains, MD as PCP - General (Family Medicine) Rolm Bookbinder, MD as Consulting Physician (General Surgery) Nicholas Lose, MD as Consulting Physician (Hematology and Oncology) Gery Pray, MD as Consulting Physician (Radiation Oncology)  DIAGNOSIS:  Encounter Diagnosis  Name Primary?  . Malignant neoplasm of upper-outer quadrant of right breast in female, estrogen receptor positive ([place])     SUMMARY OF ONCOLOGIC HISTORY:   Malignant neoplasm of upper-outer quadrant of right breast in female, estrogen receptor positive ([place])   05/03/2017 Initial Diagnosis    Screening detected right breast calcifications 7 mm, right axilla negative, tumor biopsy grade 3 IDC with DCIS plus LCIS ER 100%, PR 2%, HER-2 negative ratio 1.45, Ki-67 25%, T1 BN 0 stage IA clinical stage      05/29/2017 Surgery    Right lumpectomy: IDC grade 2, 1.8 cm, DCIS intermediate grade, margins negative, 0/2 lymph nodes negative, ER 100%, PR 2%, HER-2 negative ratio 1.45, Ki-67 25%, T1c N0 stage IA      06/06/2017 Oncotype testing    Oncotype DX score 20:13% risk of recurrence       CHIEF COMPLIANT: Follow-up to discuss the results of right lumpectomy and Oncotype testing  INTERVAL HISTORY: Diana Jacobson is a 61 year old with above-mentioned history of right breast cancer who underwent lumpectomy and Oncotype DX testing. She is here today to discuss the results of the lumpectomy surgery as well as Oncotype score. She is healed very well from the surgery and does not appear to have any new problems or concerns.  REVIEW OF SYSTEMS:   Constitutional: Denies fevers, chills or abnormal weight loss Eyes: Denies blurriness of vision Ears, nose, mouth, throat, and face: Denies mucositis or sore throat Respiratory: Denies cough, dyspnea or wheezes Cardiovascular: Denies palpitation, chest discomfort Gastrointestinal:  Denies nausea, heartburn or change in bowel habits Skin: Denies  abnormal skin rashes Lymphatics: Denies new lymphadenopathy or easy bruising Neurological:Denies numbness, tingling or new weaknesses Behavioral/Psych: Mood is stable, no new changes  Extremities: No lower extremity edema All other systems were reviewed with the patient and are negative.  I have reviewed the past medical history, past surgical history, social history and family history with the patient and they are unchanged from previous note.  ALLERGIES:  has No Known Allergies.  MEDICATIONS:  Current Outpatient Prescriptions  Medication Sig Dispense Refill  . Bioflavonoid Products (ESTER-C) 500-200-60 MG TABS Take 1 tablet by mouth daily.    . Calcium 500-125 MG-UNIT TABS Take 2 tablets by mouth daily.    Marland Kitchen glucosamine-chondroitin 500-400 MG tablet Take 1 tablet by mouth daily.    Marland Kitchen levocetirizine (XYZAL) 5 MG tablet Take 1 tablet (5 mg total) by mouth every evening. 30 tablet 12  . levothyroxine (SYNTHROID, LEVOTHROID) 88 MCG tablet Take 88 mcg by mouth daily before breakfast.    . Multiple Vitamin (MULTIVITAMIN) tablet Take 1 tablet by mouth daily.    . NON FORMULARY Take 1 tablet by mouth daily. DIGEST ZEN TABLET    . NON FORMULARY Take 1 tablet by mouth daily. phytoestrogen for hot flashes    . oxyCODONE-acetaminophen (ROXICET) 5-325 MG tablet Take 1 tablet by mouth every 6 (six) hours as needed. 15 tablet 0   No current facility-administered medications for this visit.     PHYSICAL EXAMINATION: ECOG PERFORMANCE STATUS: 1 - Symptomatic but completely ambulatory  Vitals:   06/12/17 1435  BP: 118/69  Pulse: 60  Resp: 18  Temp: 97.9 F (36.6 C)   Filed  Weights   06/12/17 1435  Weight: 156 lb 12.8 oz (71.1 kg)    GENERAL:alert, no distress and comfortable SKIN: skin color, texture, turgor are normal, no rashes or significant lesions EYES: normal, Conjunctiva are pink and non-injected, sclera clear OROPHARYNX:no exudate, no erythema and lips, buccal mucosa, and tongue  normal  NECK: supple, thyroid normal size, non-tender, without nodularity LYMPH:  no palpable lymphadenopathy in the cervical, axillary or inguinal LUNGS: clear to auscultation and percussion with normal breathing effort HEART: regular rate & rhythm and no murmurs and no lower extremity edema ABDOMEN:abdomen soft, non-tender and normal bowel sounds MUSCULOSKELETAL:no cyanosis of digits and no clubbing  NEURO: alert & oriented x 3 with fluent speech, no focal motor/sensory deficits EXTREMITIES: No lower extremity edema  LABORATORY DATA:  I have reviewed the data as listed   Chemistry      Component Value Date/Time   NA 140 05/10/2017 1230   K 4.2 05/10/2017 1230   CO2 28 05/10/2017 1230   BUN 15.5 05/10/2017 1230   CREATININE 0.8 05/10/2017 1230      Component Value Date/Time   CALCIUM 9.8 05/10/2017 1230   ALKPHOS 75 05/10/2017 1230   AST 18 05/10/2017 1230   ALT 14 05/10/2017 1230   BILITOT 0.49 05/10/2017 1230       Lab Results  Component Value Date   WBC 4.6 05/10/2017   HGB 13.7 05/10/2017   HCT 40.8 05/10/2017   MCV 90.5 05/10/2017   PLT 229 05/10/2017   NEUTROABS 2.8 05/10/2017    ASSESSMENT & PLAN:  Malignant neoplasm of upper-outer quadrant of right breast in female, estrogen receptor positive ([facility]) 05/29/2017 Right lumpectomy: IDC grade 2, 1.8 cm, DCIS intermediate grade, margins negative, 0/2 lymph nodes negative, ER 100%, PR 2%, HER-2 negative ratio 1.45, Ki-67 25%, T1c N0 stage IA  Pathology counseling: I discussed the final pathology report of the patient provided  a copy of this report. I discussed the margins as well as lymph node surgeries. We also discussed the final staging along with previously performed ER/PR and HER-2/neu testing. Oncotype DX testing reveals score of 20; risk of recurrence 13%, no role of chemotherapy  Treatment plan: 1. Adjuvant radiation therapy followed by 2. Adjuvant antiestrogen therapy  Return to clinic at the end of  radiation therapy. Radiation is being planned to be done at [facility].  I spent 25 minutes talking to the patient of which more than half was spent in counseling and coordination of care.  No orders of the defined types were placed in this encounter.  The patient has a good understanding of the overall plan. she agrees with it. she will call with any problems that may develop before the next visit here.   Gudena, Vinay K, MD 06/12/17    

## 2017-06-12 NOTE — Assessment & Plan Note (Signed)
05/29/2017 Right lumpectomy: IDC grade 2, 1.8 cm, DCIS intermediate grade, margins negative, 0/2 lymph nodes negative, ER 100%, PR 2%, HER-2 negative ratio 1.45, Ki-67 25%, T1c N0 stage IA  Pathology counseling: I discussed the final pathology report of the patient provided  a copy of this report. I discussed the margins as well as lymph node surgeries. We also discussed the final staging along with previously performed ER/PR and HER-2/neu testing.  Treatment plan: 1. Oncotype DX testing to determine if chemotherapy would be of any benefit followed by 2. Adjuvant radiation therapy followed by 3. Adjuvant antiestrogen therapy  Return to clinic based upon Oncotype DX testing

## 2017-06-13 ENCOUNTER — Telehealth: Payer: Self-pay | Admitting: *Deleted

## 2017-06-13 NOTE — Telephone Encounter (Signed)
Received oncotype score of 20/13%. Physician team notified. Referral sent to [facility] for xrt per pt wish d/t pt works at [employer]. Referral sent. Pt denies further needs at this time.

## 2017-06-23 ENCOUNTER — Ambulatory Visit
Admission: RE | Admit: 2017-06-23 | Discharge: 2017-06-23 | Disposition: A | Discharge status: Home / Self Care | Payer: 59 | Source: Ambulatory Visit | Attending: Radiation Oncology | Admitting: Radiation Oncology

## 2017-06-23 ENCOUNTER — Encounter (HOSPITAL_COMMUNITY): Payer: Self-pay

## 2017-06-23 ENCOUNTER — Encounter: Payer: Self-pay | Admitting: Radiation Oncology

## 2017-06-23 VITALS — BP 130/84 | HR 68 | Temp 97.0°F | Wt 156.2 lb

## 2017-06-23 DIAGNOSIS — Z800 Family history of malignant neoplasm of digestive organs: Secondary | ICD-10-CM | POA: Diagnosis not present

## 2017-06-23 DIAGNOSIS — Z801 Family history of malignant neoplasm of trachea, bronchus and lung: Secondary | ICD-10-CM | POA: Diagnosis not present

## 2017-06-23 DIAGNOSIS — C50411 Malignant neoplasm of upper-outer quadrant of right female breast: Secondary | ICD-10-CM

## 2017-06-23 DIAGNOSIS — Z510 Encounter for antineoplastic radiation therapy: Secondary | ICD-10-CM | POA: Diagnosis not present

## 2017-06-23 DIAGNOSIS — Z808 Family history of malignant neoplasm of other organs or systems: Secondary | ICD-10-CM | POA: Diagnosis not present

## 2017-06-23 DIAGNOSIS — Z760 Encounter for issue of repeat prescription: Secondary | ICD-10-CM | POA: Diagnosis not present

## 2017-06-23 DIAGNOSIS — Z170 Estrogen receptor positive status [ER+]: Secondary | ICD-10-CM

## 2017-06-23 DIAGNOSIS — K219 Gastro-esophageal reflux disease without esophagitis: Secondary | ICD-10-CM | POA: Diagnosis not present

## 2017-06-23 DIAGNOSIS — E039 Hypothyroidism, unspecified: Secondary | ICD-10-CM | POA: Diagnosis not present

## 2017-06-23 DIAGNOSIS — Z87891 Personal history of nicotine dependence: Secondary | ICD-10-CM | POA: Diagnosis not present

## 2017-06-23 DIAGNOSIS — M8580 Other specified disorders of bone density and structure, unspecified site: Secondary | ICD-10-CM | POA: Diagnosis not present

## 2017-06-23 NOTE — Consult Note (Signed)
NEW PATIENT EVALUATION  Name: Diana Jacobson  MRN: [id number]  Date:   06/23/2017     DOB: [birth year]   This 61 y.o. female patient presents to the clinic for initial evaluation of stage I a (T1 CN 0 M0) ER positive PR borderline HER-2/neu not overexpressed status post wide local excision and sentinel node biopsy with low risk of recurrence by Oncotype DX  REFERRING PHYSICIAN: Harlan Stains, MD  CHIEF COMPLAINT:  Chief Complaint  Patient presents with  . Breast Cancer    Initial Evaluation    DIAGNOSIS: The encounter diagnosis was Malignant neoplasm of upper-outer quadrant of right breast in female, estrogen receptor positive ([place]).   PREVIOUS INVESTIGATIONS:  Pathology reports reviewed Mammogram and ultrasound reviewed Clinical notes reviewed  HPI: Patient is a pleasant 61 year old female who presented with an abnormal mammogram of right breast showing abnormal calcifications in the right breast. This prompted ultrasound-guided biopsy which was positive for invasive ductal carcinoma with ductal carcinoma in situ plus lobular carcinoma in situ features. Tumor was strongly ER/PR positive PR borderline HER-2/neu not overexpressed. She went on to have a wide local excision for a 1.8 cm overall grade 2 invasive mammary carcinoma with 2 sentinel lymph nodes negative for metastatic disease. Margins were clear at 0.4 cm laterally. Patient did well postoperatively Oncotype DX was performed showing a recurrence risk of 13% not prompting adjuvant chemotherapy. She is healing well. She specifically denies breast tenderness cough or bone pain. Recommend patient has been made for adjuvant antiestrogen therapy after completion of radiation. She is now seen for radiation oncology opinion.  PLANNED TREATMENT REGIMEN: Hypofractionated course of right breast radiation  PAST MEDICAL HISTORY:  has a past medical history of Cancer ([place]) (04/2017); GERD (gastroesophageal reflux disease); Hypothyroidism; and  Osteopenia.    PAST SURGICAL HISTORY:  Past Surgical History:  Procedure Laterality Date  . BREAST LUMPECTOMY WITH RADIOACTIVE SEED AND SENTINEL LYMPH NODE BIOPSY Right 05/29/2017   Procedure: BREAST LUMPECTOMY WITH RADIOACTIVE SEED AND SENTINEL LYMPH NODE BIOPSY;  Surgeon: Rolm Bookbinder, MD;  Location: [facility];  Service: General;  Laterality: Right;  . ROTATOR CUFF REPAIR    . TONSILLECTOMY      FAMILY HISTORY: family history includes Cancer (age of onset: 20) in her cousin; Cancer (age of onset: 38) in her mother; Cancer (age of onset: 14) in her father; Cancer (age of onset: 74) in her maternal grandmother; Colon cancer in her father and maternal grandmother; Lung cancer in her mother.  SOCIAL HISTORY:  reports that she quit smoking about 42 years ago. She has never used smokeless tobacco. She reports that she drinks about 3.0 oz of alcohol per week . She reports that she does not use drugs.  ALLERGIES: Patient has no known allergies.  MEDICATIONS:  Current Outpatient Prescriptions  Medication Sig Dispense Refill  . Bioflavonoid Products (ESTER-C) 500-200-60 MG TABS Take 1 tablet by mouth daily.    . Calcium 500-125 MG-UNIT TABS Take 2 tablets by mouth daily.    Marland Kitchen glucosamine-chondroitin 500-400 MG tablet Take 1 tablet by mouth daily.    Marland Kitchen levocetirizine (XYZAL) 5 MG tablet Take 1 tablet (5 mg total) by mouth every evening. 30 tablet 12  . levothyroxine (SYNTHROID, LEVOTHROID) 88 MCG tablet Take 88 mcg by mouth daily before breakfast.    . Multiple Vitamin (MULTIVITAMIN) tablet Take 1 tablet by mouth daily.    . NON FORMULARY Take 1 tablet by mouth daily. DIGEST ZEN TABLET    .  NON FORMULARY Take 1 tablet by mouth daily. phytoestrogen for hot flashes    . oxyCODONE-acetaminophen (ROXICET) 5-325 MG tablet Take 1 tablet by mouth every 6 (six) hours as needed. 15 tablet 0   No current facility-administered medications for this encounter.     ECOG PERFORMANCE  STATUS:  0 - Asymptomatic  REVIEW OF SYSTEMS:  Patient denies any weight loss, fatigue, weakness, fever, chills or night sweats. Patient denies any loss of vision, blurred vision. Patient denies any ringing  of the ears or hearing loss. No irregular heartbeat. Patient denies heart murmur or history of fainting. Patient denies any chest pain or pain radiating to her upper extremities. Patient denies any shortness of breath, difficulty breathing at night, cough or hemoptysis. Patient denies any swelling in the lower legs. Patient denies any nausea vomiting, vomiting of blood, or coffee ground material in the vomitus. Patient denies any stomach pain. Patient states has had normal bowel movements no significant constipation or diarrhea. Patient denies any dysuria, hematuria or significant nocturia. Patient denies any problems walking, swelling in the joints or loss of balance. Patient denies any skin changes, loss of hair or loss of weight. Patient denies any excessive worrying or anxiety or significant depression. Patient denies any problems with insomnia. Patient denies excessive thirst, polyuria, polydipsia. Patient denies any swollen glands, patient denies easy bruising or easy bleeding. Patient denies any recent infections, allergies or URI. Patient "s visual fields have not changed significantly in recent time.    PHYSICAL EXAM: BP 130/84   Pulse 68   Temp (!) 97 F (36.1 C)   Wt 156 lb 3.1 oz (70.8 kg)   BMI 25.99 kg/m  She status post wide local excision of the right breast with incision healing well. No dominant mass or nodularity is noted in either breast in 2 positions examined. No axillary or supraclavicular adenopathy is appreciated. Well-developed well-nourished patient in NAD. HEENT reveals PERLA, EOMI, discs not visualized.  Oral cavity is clear. No oral mucosal lesions are identified. Neck is clear without evidence of cervical or supraclavicular adenopathy. Lungs are clear to A&P. Cardiac  examination is essentially unremarkable with regular rate and rhythm without murmur rub or thrill. Abdomen is benign with no organomegaly or masses noted. Motor sensory and DTR levels are equal and symmetric in the upper and lower extremities. Cranial nerves II through XII are grossly intact. Proprioception is intact. No peripheral adenopathy or edema is identified. No motor or sensory levels are noted. Crude visual fields are within normal range.  LABORATORY DATA: Pathology reports reviewed    RADIOLOGY RESULTS: Mammogram and ultrasound reviewed   IMPRESSION: Stage Ia invasive mammary carcinoma the right breast status post wide local excision ER positive with low risk of recurrence by Oncotype DX in 61 year old  PLAN: At this time based on the patient patient's breast volume I believe to be an excellent candidate for hypofractionated course of radiation therapy over 4 weeks. Would also boost her scar another 1400 cGy using electron beam. Risks and benefits of treatment including skin reaction fatigue alteration of blood counts possible inclusion of superficial lung all were described in detail to the patient. She seems to comprehend my treatment plan well. I have personally ordered and scheduled CT simulation for next week. She will also be candidate for antiestrogen therapy after completion of radiation.  I would like to take this opportunity to thank you for allowing me to participate in the care of your patient.Armstead Peaks., MD

## 2017-06-28 ENCOUNTER — Ambulatory Visit
Admission: RE | Admit: 2017-06-28 | Discharge: 2017-06-28 | Disposition: A | Discharge status: Home / Self Care | Payer: 59 | Source: Ambulatory Visit | Attending: Radiation Oncology | Admitting: Radiation Oncology

## 2017-06-28 ENCOUNTER — Ambulatory Visit: Payer: 59

## 2017-06-28 DIAGNOSIS — E039 Hypothyroidism, unspecified: Secondary | ICD-10-CM | POA: Diagnosis not present

## 2017-06-28 DIAGNOSIS — C50411 Malignant neoplasm of upper-outer quadrant of right female breast: Secondary | ICD-10-CM | POA: Diagnosis not present

## 2017-06-28 DIAGNOSIS — Z808 Family history of malignant neoplasm of other organs or systems: Secondary | ICD-10-CM | POA: Insufficient documentation

## 2017-06-28 DIAGNOSIS — Z801 Family history of malignant neoplasm of trachea, bronchus and lung: Secondary | ICD-10-CM | POA: Insufficient documentation

## 2017-06-28 DIAGNOSIS — K219 Gastro-esophageal reflux disease without esophagitis: Secondary | ICD-10-CM | POA: Diagnosis not present

## 2017-06-28 DIAGNOSIS — M8580 Other specified disorders of bone density and structure, unspecified site: Secondary | ICD-10-CM | POA: Insufficient documentation

## 2017-06-28 DIAGNOSIS — Z87891 Personal history of nicotine dependence: Secondary | ICD-10-CM | POA: Insufficient documentation

## 2017-06-28 DIAGNOSIS — Z170 Estrogen receptor positive status [ER+]: Secondary | ICD-10-CM | POA: Insufficient documentation

## 2017-06-28 DIAGNOSIS — Z510 Encounter for antineoplastic radiation therapy: Secondary | ICD-10-CM | POA: Insufficient documentation

## 2017-06-28 DIAGNOSIS — Z800 Family history of malignant neoplasm of digestive organs: Secondary | ICD-10-CM | POA: Insufficient documentation

## 2017-06-30 ENCOUNTER — Other Ambulatory Visit: Payer: Self-pay | Admitting: *Deleted

## 2017-06-30 DIAGNOSIS — C50411 Malignant neoplasm of upper-outer quadrant of right female breast: Secondary | ICD-10-CM

## 2017-06-30 DIAGNOSIS — Z170 Estrogen receptor positive status [ER+]: Principal | ICD-10-CM

## 2017-07-01 ENCOUNTER — Telehealth: Payer: Self-pay | Admitting: Hematology and Oncology

## 2017-07-01 NOTE — Telephone Encounter (Signed)
lvm to inform pt of 8/20 appt per sch msg

## 2017-07-03 ENCOUNTER — Encounter: Payer: Self-pay | Admitting: Hematology and Oncology

## 2017-07-03 ENCOUNTER — Encounter (HOSPITAL_COMMUNITY): Payer: Self-pay

## 2017-07-03 DIAGNOSIS — Z808 Family history of malignant neoplasm of other organs or systems: Secondary | ICD-10-CM | POA: Diagnosis not present

## 2017-07-03 DIAGNOSIS — E039 Hypothyroidism, unspecified: Secondary | ICD-10-CM | POA: Diagnosis not present

## 2017-07-03 DIAGNOSIS — K219 Gastro-esophageal reflux disease without esophagitis: Secondary | ICD-10-CM | POA: Diagnosis not present

## 2017-07-03 DIAGNOSIS — Z510 Encounter for antineoplastic radiation therapy: Secondary | ICD-10-CM | POA: Diagnosis not present

## 2017-07-03 DIAGNOSIS — Z87891 Personal history of nicotine dependence: Secondary | ICD-10-CM | POA: Diagnosis not present

## 2017-07-03 DIAGNOSIS — Z800 Family history of malignant neoplasm of digestive organs: Secondary | ICD-10-CM | POA: Diagnosis not present

## 2017-07-03 DIAGNOSIS — M8580 Other specified disorders of bone density and structure, unspecified site: Secondary | ICD-10-CM | POA: Diagnosis not present

## 2017-07-03 DIAGNOSIS — Z170 Estrogen receptor positive status [ER+]: Secondary | ICD-10-CM | POA: Diagnosis not present

## 2017-07-03 DIAGNOSIS — C50411 Malignant neoplasm of upper-outer quadrant of right female breast: Secondary | ICD-10-CM | POA: Diagnosis not present

## 2017-07-03 NOTE — Progress Notes (Signed)
Social worker met with patient at the cancer center for counseling.

## 2017-07-03 NOTE — Progress Notes (Signed)
Saw patient on 06/30/2017 at the Cancer center for counseling.

## 2017-07-05 ENCOUNTER — Ambulatory Visit
Admission: RE | Admit: 2017-07-05 | Discharge: 2017-07-05 | Disposition: A | Discharge status: Home / Self Care | Payer: 59 | Source: Ambulatory Visit | Attending: Radiation Oncology | Admitting: Radiation Oncology

## 2017-07-05 DIAGNOSIS — Z800 Family history of malignant neoplasm of digestive organs: Secondary | ICD-10-CM | POA: Diagnosis not present

## 2017-07-05 DIAGNOSIS — M8580 Other specified disorders of bone density and structure, unspecified site: Secondary | ICD-10-CM | POA: Diagnosis not present

## 2017-07-05 DIAGNOSIS — Z510 Encounter for antineoplastic radiation therapy: Secondary | ICD-10-CM | POA: Diagnosis not present

## 2017-07-05 DIAGNOSIS — K219 Gastro-esophageal reflux disease without esophagitis: Secondary | ICD-10-CM | POA: Diagnosis not present

## 2017-07-05 DIAGNOSIS — Z87891 Personal history of nicotine dependence: Secondary | ICD-10-CM | POA: Diagnosis not present

## 2017-07-05 DIAGNOSIS — Z808 Family history of malignant neoplasm of other organs or systems: Secondary | ICD-10-CM | POA: Diagnosis not present

## 2017-07-05 DIAGNOSIS — Z170 Estrogen receptor positive status [ER+]: Secondary | ICD-10-CM | POA: Diagnosis not present

## 2017-07-05 DIAGNOSIS — E039 Hypothyroidism, unspecified: Secondary | ICD-10-CM | POA: Diagnosis not present

## 2017-07-05 DIAGNOSIS — C50411 Malignant neoplasm of upper-outer quadrant of right female breast: Secondary | ICD-10-CM | POA: Diagnosis not present

## 2017-07-06 ENCOUNTER — Ambulatory Visit
Admission: RE | Admit: 2017-07-06 | Discharge: 2017-07-06 | Disposition: A | Discharge status: Home / Self Care | Payer: 59 | Source: Ambulatory Visit | Attending: Radiation Oncology | Admitting: Radiation Oncology

## 2017-07-06 DIAGNOSIS — M8580 Other specified disorders of bone density and structure, unspecified site: Secondary | ICD-10-CM | POA: Diagnosis not present

## 2017-07-06 DIAGNOSIS — C50411 Malignant neoplasm of upper-outer quadrant of right female breast: Secondary | ICD-10-CM | POA: Diagnosis not present

## 2017-07-06 DIAGNOSIS — K219 Gastro-esophageal reflux disease without esophagitis: Secondary | ICD-10-CM | POA: Diagnosis not present

## 2017-07-06 DIAGNOSIS — Z170 Estrogen receptor positive status [ER+]: Secondary | ICD-10-CM | POA: Diagnosis not present

## 2017-07-06 DIAGNOSIS — Z87891 Personal history of nicotine dependence: Secondary | ICD-10-CM | POA: Diagnosis not present

## 2017-07-06 DIAGNOSIS — Z808 Family history of malignant neoplasm of other organs or systems: Secondary | ICD-10-CM | POA: Diagnosis not present

## 2017-07-06 DIAGNOSIS — Z510 Encounter for antineoplastic radiation therapy: Secondary | ICD-10-CM | POA: Diagnosis not present

## 2017-07-06 DIAGNOSIS — Z800 Family history of malignant neoplasm of digestive organs: Secondary | ICD-10-CM | POA: Diagnosis not present

## 2017-07-06 DIAGNOSIS — E039 Hypothyroidism, unspecified: Secondary | ICD-10-CM | POA: Diagnosis not present

## 2017-07-07 ENCOUNTER — Ambulatory Visit
Admission: RE | Admit: 2017-07-07 | Discharge: 2017-07-07 | Disposition: A | Discharge status: Home / Self Care | Payer: 59 | Source: Ambulatory Visit | Attending: Radiation Oncology | Admitting: Radiation Oncology

## 2017-07-07 ENCOUNTER — Encounter (HOSPITAL_COMMUNITY): Payer: Self-pay

## 2017-07-07 DIAGNOSIS — Z800 Family history of malignant neoplasm of digestive organs: Secondary | ICD-10-CM | POA: Diagnosis not present

## 2017-07-07 DIAGNOSIS — C50411 Malignant neoplasm of upper-outer quadrant of right female breast: Secondary | ICD-10-CM | POA: Diagnosis not present

## 2017-07-07 DIAGNOSIS — Z808 Family history of malignant neoplasm of other organs or systems: Secondary | ICD-10-CM | POA: Diagnosis not present

## 2017-07-07 DIAGNOSIS — Z510 Encounter for antineoplastic radiation therapy: Secondary | ICD-10-CM | POA: Diagnosis not present

## 2017-07-07 DIAGNOSIS — K219 Gastro-esophageal reflux disease without esophagitis: Secondary | ICD-10-CM | POA: Diagnosis not present

## 2017-07-07 DIAGNOSIS — E039 Hypothyroidism, unspecified: Secondary | ICD-10-CM | POA: Diagnosis not present

## 2017-07-07 DIAGNOSIS — Z87891 Personal history of nicotine dependence: Secondary | ICD-10-CM | POA: Diagnosis not present

## 2017-07-07 DIAGNOSIS — M8580 Other specified disorders of bone density and structure, unspecified site: Secondary | ICD-10-CM | POA: Diagnosis not present

## 2017-07-07 DIAGNOSIS — Z170 Estrogen receptor positive status [ER+]: Secondary | ICD-10-CM | POA: Diagnosis not present

## 2017-07-07 NOTE — Progress Notes (Signed)
Social worker provided counseling to patient at the Cancer Center. 

## 2017-07-10 ENCOUNTER — Ambulatory Visit
Admission: RE | Admit: 2017-07-10 | Discharge: 2017-07-10 | Disposition: A | Discharge status: Home / Self Care | Payer: 59 | Source: Ambulatory Visit | Attending: Radiation Oncology | Admitting: Radiation Oncology

## 2017-07-10 DIAGNOSIS — K219 Gastro-esophageal reflux disease without esophagitis: Secondary | ICD-10-CM | POA: Diagnosis not present

## 2017-07-10 DIAGNOSIS — Z87891 Personal history of nicotine dependence: Secondary | ICD-10-CM | POA: Diagnosis not present

## 2017-07-10 DIAGNOSIS — Z808 Family history of malignant neoplasm of other organs or systems: Secondary | ICD-10-CM | POA: Diagnosis not present

## 2017-07-10 DIAGNOSIS — Z510 Encounter for antineoplastic radiation therapy: Secondary | ICD-10-CM | POA: Diagnosis not present

## 2017-07-10 DIAGNOSIS — Z170 Estrogen receptor positive status [ER+]: Secondary | ICD-10-CM | POA: Diagnosis not present

## 2017-07-10 DIAGNOSIS — M8580 Other specified disorders of bone density and structure, unspecified site: Secondary | ICD-10-CM | POA: Diagnosis not present

## 2017-07-10 DIAGNOSIS — Z800 Family history of malignant neoplasm of digestive organs: Secondary | ICD-10-CM | POA: Diagnosis not present

## 2017-07-10 DIAGNOSIS — C50411 Malignant neoplasm of upper-outer quadrant of right female breast: Secondary | ICD-10-CM | POA: Diagnosis not present

## 2017-07-10 DIAGNOSIS — E039 Hypothyroidism, unspecified: Secondary | ICD-10-CM | POA: Diagnosis not present

## 2017-07-11 ENCOUNTER — Ambulatory Visit
Admission: RE | Admit: 2017-07-11 | Discharge: 2017-07-11 | Disposition: A | Discharge status: Home / Self Care | Payer: 59 | Source: Ambulatory Visit | Attending: Radiation Oncology | Admitting: Radiation Oncology

## 2017-07-11 DIAGNOSIS — Z800 Family history of malignant neoplasm of digestive organs: Secondary | ICD-10-CM | POA: Diagnosis not present

## 2017-07-11 DIAGNOSIS — M8580 Other specified disorders of bone density and structure, unspecified site: Secondary | ICD-10-CM | POA: Diagnosis not present

## 2017-07-11 DIAGNOSIS — C50411 Malignant neoplasm of upper-outer quadrant of right female breast: Secondary | ICD-10-CM | POA: Diagnosis not present

## 2017-07-11 DIAGNOSIS — Z510 Encounter for antineoplastic radiation therapy: Secondary | ICD-10-CM | POA: Diagnosis not present

## 2017-07-11 DIAGNOSIS — Z87891 Personal history of nicotine dependence: Secondary | ICD-10-CM | POA: Diagnosis not present

## 2017-07-11 DIAGNOSIS — E039 Hypothyroidism, unspecified: Secondary | ICD-10-CM | POA: Diagnosis not present

## 2017-07-11 DIAGNOSIS — Z808 Family history of malignant neoplasm of other organs or systems: Secondary | ICD-10-CM | POA: Diagnosis not present

## 2017-07-11 DIAGNOSIS — Z170 Estrogen receptor positive status [ER+]: Secondary | ICD-10-CM | POA: Diagnosis not present

## 2017-07-11 DIAGNOSIS — K219 Gastro-esophageal reflux disease without esophagitis: Secondary | ICD-10-CM | POA: Diagnosis not present

## 2017-07-12 ENCOUNTER — Ambulatory Visit
Admission: RE | Admit: 2017-07-12 | Discharge: 2017-07-12 | Disposition: A | Discharge status: Home / Self Care | Payer: 59 | Source: Ambulatory Visit | Attending: Radiation Oncology | Admitting: Radiation Oncology

## 2017-07-12 DIAGNOSIS — K219 Gastro-esophageal reflux disease without esophagitis: Secondary | ICD-10-CM | POA: Diagnosis not present

## 2017-07-12 DIAGNOSIS — Z170 Estrogen receptor positive status [ER+]: Secondary | ICD-10-CM | POA: Diagnosis not present

## 2017-07-12 DIAGNOSIS — Z800 Family history of malignant neoplasm of digestive organs: Secondary | ICD-10-CM | POA: Diagnosis not present

## 2017-07-12 DIAGNOSIS — C50411 Malignant neoplasm of upper-outer quadrant of right female breast: Secondary | ICD-10-CM | POA: Diagnosis not present

## 2017-07-12 DIAGNOSIS — M8580 Other specified disorders of bone density and structure, unspecified site: Secondary | ICD-10-CM | POA: Diagnosis not present

## 2017-07-12 DIAGNOSIS — Z510 Encounter for antineoplastic radiation therapy: Secondary | ICD-10-CM | POA: Diagnosis not present

## 2017-07-12 DIAGNOSIS — E039 Hypothyroidism, unspecified: Secondary | ICD-10-CM | POA: Diagnosis not present

## 2017-07-12 DIAGNOSIS — Z87891 Personal history of nicotine dependence: Secondary | ICD-10-CM | POA: Diagnosis not present

## 2017-07-12 DIAGNOSIS — Z808 Family history of malignant neoplasm of other organs or systems: Secondary | ICD-10-CM | POA: Diagnosis not present

## 2017-07-13 ENCOUNTER — Ambulatory Visit
Admission: RE | Admit: 2017-07-13 | Discharge: 2017-07-13 | Disposition: A | Discharge status: Home / Self Care | Payer: 59 | Source: Ambulatory Visit | Attending: Radiation Oncology | Admitting: Radiation Oncology

## 2017-07-13 DIAGNOSIS — Z800 Family history of malignant neoplasm of digestive organs: Secondary | ICD-10-CM | POA: Diagnosis not present

## 2017-07-13 DIAGNOSIS — Z170 Estrogen receptor positive status [ER+]: Secondary | ICD-10-CM | POA: Diagnosis not present

## 2017-07-13 DIAGNOSIS — C50411 Malignant neoplasm of upper-outer quadrant of right female breast: Secondary | ICD-10-CM | POA: Diagnosis not present

## 2017-07-13 DIAGNOSIS — Z808 Family history of malignant neoplasm of other organs or systems: Secondary | ICD-10-CM | POA: Diagnosis not present

## 2017-07-13 DIAGNOSIS — K219 Gastro-esophageal reflux disease without esophagitis: Secondary | ICD-10-CM | POA: Diagnosis not present

## 2017-07-13 DIAGNOSIS — Z510 Encounter for antineoplastic radiation therapy: Secondary | ICD-10-CM | POA: Diagnosis not present

## 2017-07-13 DIAGNOSIS — Z87891 Personal history of nicotine dependence: Secondary | ICD-10-CM | POA: Diagnosis not present

## 2017-07-13 DIAGNOSIS — M8580 Other specified disorders of bone density and structure, unspecified site: Secondary | ICD-10-CM | POA: Diagnosis not present

## 2017-07-13 DIAGNOSIS — E039 Hypothyroidism, unspecified: Secondary | ICD-10-CM | POA: Diagnosis not present

## 2017-07-14 ENCOUNTER — Ambulatory Visit
Admission: RE | Admit: 2017-07-14 | Discharge: 2017-07-14 | Disposition: A | Discharge status: Home / Self Care | Payer: 59 | Source: Ambulatory Visit | Attending: Radiation Oncology | Admitting: Radiation Oncology

## 2017-07-14 DIAGNOSIS — Z170 Estrogen receptor positive status [ER+]: Secondary | ICD-10-CM | POA: Diagnosis not present

## 2017-07-14 DIAGNOSIS — C50411 Malignant neoplasm of upper-outer quadrant of right female breast: Secondary | ICD-10-CM | POA: Diagnosis not present

## 2017-07-14 DIAGNOSIS — Z800 Family history of malignant neoplasm of digestive organs: Secondary | ICD-10-CM | POA: Diagnosis not present

## 2017-07-14 DIAGNOSIS — E039 Hypothyroidism, unspecified: Secondary | ICD-10-CM | POA: Diagnosis not present

## 2017-07-14 DIAGNOSIS — Z808 Family history of malignant neoplasm of other organs or systems: Secondary | ICD-10-CM | POA: Diagnosis not present

## 2017-07-14 DIAGNOSIS — K219 Gastro-esophageal reflux disease without esophagitis: Secondary | ICD-10-CM | POA: Diagnosis not present

## 2017-07-14 DIAGNOSIS — Z87891 Personal history of nicotine dependence: Secondary | ICD-10-CM | POA: Diagnosis not present

## 2017-07-14 DIAGNOSIS — Z510 Encounter for antineoplastic radiation therapy: Secondary | ICD-10-CM | POA: Diagnosis not present

## 2017-07-14 DIAGNOSIS — M8580 Other specified disorders of bone density and structure, unspecified site: Secondary | ICD-10-CM | POA: Diagnosis not present

## 2017-07-17 ENCOUNTER — Inpatient Hospital Stay: Payer: 59 | Attending: Radiation Oncology

## 2017-07-17 ENCOUNTER — Ambulatory Visit
Admission: RE | Admit: 2017-07-17 | Discharge: 2017-07-17 | Disposition: A | Discharge status: Home / Self Care | Payer: 59 | Source: Ambulatory Visit | Attending: Radiation Oncology | Admitting: Radiation Oncology

## 2017-07-17 ENCOUNTER — Other Ambulatory Visit: Payer: Self-pay

## 2017-07-17 DIAGNOSIS — C50411 Malignant neoplasm of upper-outer quadrant of right female breast: Secondary | ICD-10-CM | POA: Diagnosis not present

## 2017-07-17 DIAGNOSIS — Z170 Estrogen receptor positive status [ER+]: Principal | ICD-10-CM

## 2017-07-17 DIAGNOSIS — Z808 Family history of malignant neoplasm of other organs or systems: Secondary | ICD-10-CM | POA: Diagnosis not present

## 2017-07-17 DIAGNOSIS — Z510 Encounter for antineoplastic radiation therapy: Secondary | ICD-10-CM | POA: Diagnosis not present

## 2017-07-17 DIAGNOSIS — Z87891 Personal history of nicotine dependence: Secondary | ICD-10-CM | POA: Diagnosis not present

## 2017-07-17 DIAGNOSIS — Z800 Family history of malignant neoplasm of digestive organs: Secondary | ICD-10-CM | POA: Diagnosis not present

## 2017-07-17 DIAGNOSIS — M8580 Other specified disorders of bone density and structure, unspecified site: Secondary | ICD-10-CM | POA: Diagnosis not present

## 2017-07-17 DIAGNOSIS — K219 Gastro-esophageal reflux disease without esophagitis: Secondary | ICD-10-CM | POA: Diagnosis not present

## 2017-07-17 DIAGNOSIS — E039 Hypothyroidism, unspecified: Secondary | ICD-10-CM | POA: Diagnosis not present

## 2017-07-17 LAB — CBC
HCT: 38.4 % (ref 35.0–47.0)
HEMOGLOBIN: 13.2 g/dL (ref 12.0–16.0)
MCH: 30.6 pg (ref 26.0–34.0)
MCHC: 34.2 g/dL (ref 32.0–36.0)
MCV: 89.4 fL (ref 80.0–100.0)
Platelets: 210 10*3/uL (ref 150–440)
RBC: 4.3 MIL/uL (ref 3.80–5.20)
RDW: 13.4 % (ref 11.5–14.5)
WBC: 4 10*3/uL (ref 3.6–11.0)

## 2017-07-18 ENCOUNTER — Ambulatory Visit
Admission: RE | Admit: 2017-07-18 | Discharge: 2017-07-18 | Disposition: A | Discharge status: Home / Self Care | Payer: 59 | Source: Ambulatory Visit | Attending: Radiation Oncology | Admitting: Radiation Oncology

## 2017-07-18 DIAGNOSIS — Z808 Family history of malignant neoplasm of other organs or systems: Secondary | ICD-10-CM | POA: Diagnosis not present

## 2017-07-18 DIAGNOSIS — Z170 Estrogen receptor positive status [ER+]: Secondary | ICD-10-CM | POA: Diagnosis not present

## 2017-07-18 DIAGNOSIS — Z510 Encounter for antineoplastic radiation therapy: Secondary | ICD-10-CM | POA: Diagnosis not present

## 2017-07-18 DIAGNOSIS — M8580 Other specified disorders of bone density and structure, unspecified site: Secondary | ICD-10-CM | POA: Diagnosis not present

## 2017-07-18 DIAGNOSIS — E039 Hypothyroidism, unspecified: Secondary | ICD-10-CM | POA: Diagnosis not present

## 2017-07-18 DIAGNOSIS — K219 Gastro-esophageal reflux disease without esophagitis: Secondary | ICD-10-CM | POA: Diagnosis not present

## 2017-07-18 DIAGNOSIS — C50411 Malignant neoplasm of upper-outer quadrant of right female breast: Secondary | ICD-10-CM | POA: Diagnosis not present

## 2017-07-18 DIAGNOSIS — Z87891 Personal history of nicotine dependence: Secondary | ICD-10-CM | POA: Diagnosis not present

## 2017-07-18 DIAGNOSIS — Z800 Family history of malignant neoplasm of digestive organs: Secondary | ICD-10-CM | POA: Diagnosis not present

## 2017-07-19 ENCOUNTER — Ambulatory Visit
Admission: RE | Admit: 2017-07-19 | Discharge: 2017-07-19 | Disposition: A | Discharge status: Home / Self Care | Payer: 59 | Source: Ambulatory Visit | Attending: Radiation Oncology | Admitting: Radiation Oncology

## 2017-07-19 DIAGNOSIS — Z808 Family history of malignant neoplasm of other organs or systems: Secondary | ICD-10-CM | POA: Diagnosis not present

## 2017-07-19 DIAGNOSIS — Z510 Encounter for antineoplastic radiation therapy: Secondary | ICD-10-CM | POA: Diagnosis not present

## 2017-07-19 DIAGNOSIS — K219 Gastro-esophageal reflux disease without esophagitis: Secondary | ICD-10-CM | POA: Diagnosis not present

## 2017-07-19 DIAGNOSIS — C50411 Malignant neoplasm of upper-outer quadrant of right female breast: Secondary | ICD-10-CM | POA: Diagnosis not present

## 2017-07-19 DIAGNOSIS — Z800 Family history of malignant neoplasm of digestive organs: Secondary | ICD-10-CM | POA: Diagnosis not present

## 2017-07-19 DIAGNOSIS — Z170 Estrogen receptor positive status [ER+]: Secondary | ICD-10-CM | POA: Diagnosis not present

## 2017-07-19 DIAGNOSIS — E039 Hypothyroidism, unspecified: Secondary | ICD-10-CM | POA: Diagnosis not present

## 2017-07-19 DIAGNOSIS — Z87891 Personal history of nicotine dependence: Secondary | ICD-10-CM | POA: Diagnosis not present

## 2017-07-19 DIAGNOSIS — M8580 Other specified disorders of bone density and structure, unspecified site: Secondary | ICD-10-CM | POA: Diagnosis not present

## 2017-07-20 ENCOUNTER — Ambulatory Visit
Admission: RE | Admit: 2017-07-20 | Discharge: 2017-07-20 | Disposition: A | Discharge status: Home / Self Care | Payer: 59 | Source: Ambulatory Visit | Attending: Radiation Oncology | Admitting: Radiation Oncology

## 2017-07-20 DIAGNOSIS — Z800 Family history of malignant neoplasm of digestive organs: Secondary | ICD-10-CM | POA: Diagnosis not present

## 2017-07-20 DIAGNOSIS — Z170 Estrogen receptor positive status [ER+]: Secondary | ICD-10-CM | POA: Diagnosis not present

## 2017-07-20 DIAGNOSIS — K219 Gastro-esophageal reflux disease without esophagitis: Secondary | ICD-10-CM | POA: Diagnosis not present

## 2017-07-20 DIAGNOSIS — C50411 Malignant neoplasm of upper-outer quadrant of right female breast: Secondary | ICD-10-CM | POA: Diagnosis not present

## 2017-07-20 DIAGNOSIS — E039 Hypothyroidism, unspecified: Secondary | ICD-10-CM | POA: Diagnosis not present

## 2017-07-20 DIAGNOSIS — Z808 Family history of malignant neoplasm of other organs or systems: Secondary | ICD-10-CM | POA: Diagnosis not present

## 2017-07-20 DIAGNOSIS — Z510 Encounter for antineoplastic radiation therapy: Secondary | ICD-10-CM | POA: Diagnosis not present

## 2017-07-20 DIAGNOSIS — Z87891 Personal history of nicotine dependence: Secondary | ICD-10-CM | POA: Diagnosis not present

## 2017-07-20 DIAGNOSIS — M8580 Other specified disorders of bone density and structure, unspecified site: Secondary | ICD-10-CM | POA: Diagnosis not present

## 2017-07-21 ENCOUNTER — Ambulatory Visit: Payer: 59

## 2017-07-21 ENCOUNTER — Ambulatory Visit: Admission: RE | Admit: 2017-07-21 | Payer: 59 | Source: Ambulatory Visit

## 2017-07-24 ENCOUNTER — Ambulatory Visit
Admission: RE | Admit: 2017-07-24 | Discharge: 2017-07-24 | Disposition: A | Discharge status: Home / Self Care | Payer: 59 | Source: Ambulatory Visit | Attending: Radiation Oncology | Admitting: Radiation Oncology

## 2017-07-24 DIAGNOSIS — Z170 Estrogen receptor positive status [ER+]: Secondary | ICD-10-CM | POA: Diagnosis not present

## 2017-07-24 DIAGNOSIS — M8580 Other specified disorders of bone density and structure, unspecified site: Secondary | ICD-10-CM | POA: Diagnosis not present

## 2017-07-24 DIAGNOSIS — K219 Gastro-esophageal reflux disease without esophagitis: Secondary | ICD-10-CM | POA: Diagnosis not present

## 2017-07-24 DIAGNOSIS — Z808 Family history of malignant neoplasm of other organs or systems: Secondary | ICD-10-CM | POA: Diagnosis not present

## 2017-07-24 DIAGNOSIS — C50411 Malignant neoplasm of upper-outer quadrant of right female breast: Secondary | ICD-10-CM | POA: Diagnosis not present

## 2017-07-24 DIAGNOSIS — Z87891 Personal history of nicotine dependence: Secondary | ICD-10-CM | POA: Diagnosis not present

## 2017-07-24 DIAGNOSIS — Z510 Encounter for antineoplastic radiation therapy: Secondary | ICD-10-CM | POA: Diagnosis not present

## 2017-07-24 DIAGNOSIS — E039 Hypothyroidism, unspecified: Secondary | ICD-10-CM | POA: Diagnosis not present

## 2017-07-24 DIAGNOSIS — Z800 Family history of malignant neoplasm of digestive organs: Secondary | ICD-10-CM | POA: Diagnosis not present

## 2017-07-25 ENCOUNTER — Ambulatory Visit
Admission: RE | Admit: 2017-07-25 | Discharge: 2017-07-25 | Disposition: A | Discharge status: Home / Self Care | Payer: 59 | Source: Ambulatory Visit | Attending: Radiation Oncology | Admitting: Radiation Oncology

## 2017-07-25 DIAGNOSIS — Z87891 Personal history of nicotine dependence: Secondary | ICD-10-CM | POA: Diagnosis not present

## 2017-07-25 DIAGNOSIS — Z170 Estrogen receptor positive status [ER+]: Secondary | ICD-10-CM | POA: Diagnosis not present

## 2017-07-25 DIAGNOSIS — Z800 Family history of malignant neoplasm of digestive organs: Secondary | ICD-10-CM | POA: Diagnosis not present

## 2017-07-25 DIAGNOSIS — E039 Hypothyroidism, unspecified: Secondary | ICD-10-CM | POA: Diagnosis not present

## 2017-07-25 DIAGNOSIS — Z510 Encounter for antineoplastic radiation therapy: Secondary | ICD-10-CM | POA: Diagnosis not present

## 2017-07-25 DIAGNOSIS — K219 Gastro-esophageal reflux disease without esophagitis: Secondary | ICD-10-CM | POA: Diagnosis not present

## 2017-07-25 DIAGNOSIS — Z808 Family history of malignant neoplasm of other organs or systems: Secondary | ICD-10-CM | POA: Diagnosis not present

## 2017-07-25 DIAGNOSIS — C50411 Malignant neoplasm of upper-outer quadrant of right female breast: Secondary | ICD-10-CM | POA: Diagnosis not present

## 2017-07-25 DIAGNOSIS — M8580 Other specified disorders of bone density and structure, unspecified site: Secondary | ICD-10-CM | POA: Diagnosis not present

## 2017-07-26 ENCOUNTER — Ambulatory Visit
Admission: RE | Admit: 2017-07-26 | Discharge: 2017-07-26 | Disposition: A | Discharge status: Home / Self Care | Payer: 59 | Source: Ambulatory Visit | Attending: Radiation Oncology | Admitting: Radiation Oncology

## 2017-07-26 DIAGNOSIS — Z170 Estrogen receptor positive status [ER+]: Secondary | ICD-10-CM | POA: Diagnosis not present

## 2017-07-26 DIAGNOSIS — Z87891 Personal history of nicotine dependence: Secondary | ICD-10-CM | POA: Diagnosis not present

## 2017-07-26 DIAGNOSIS — C50411 Malignant neoplasm of upper-outer quadrant of right female breast: Secondary | ICD-10-CM | POA: Diagnosis not present

## 2017-07-26 DIAGNOSIS — Z800 Family history of malignant neoplasm of digestive organs: Secondary | ICD-10-CM | POA: Diagnosis not present

## 2017-07-26 DIAGNOSIS — E039 Hypothyroidism, unspecified: Secondary | ICD-10-CM | POA: Diagnosis not present

## 2017-07-26 DIAGNOSIS — M8580 Other specified disorders of bone density and structure, unspecified site: Secondary | ICD-10-CM | POA: Diagnosis not present

## 2017-07-26 DIAGNOSIS — Z510 Encounter for antineoplastic radiation therapy: Secondary | ICD-10-CM | POA: Diagnosis not present

## 2017-07-26 DIAGNOSIS — Z808 Family history of malignant neoplasm of other organs or systems: Secondary | ICD-10-CM | POA: Diagnosis not present

## 2017-07-26 DIAGNOSIS — K219 Gastro-esophageal reflux disease without esophagitis: Secondary | ICD-10-CM | POA: Diagnosis not present

## 2017-07-27 ENCOUNTER — Ambulatory Visit
Admission: RE | Admit: 2017-07-27 | Discharge: 2017-07-27 | Disposition: A | Discharge status: Home / Self Care | Payer: 59 | Source: Ambulatory Visit | Attending: Radiation Oncology | Admitting: Radiation Oncology

## 2017-07-27 DIAGNOSIS — Z510 Encounter for antineoplastic radiation therapy: Secondary | ICD-10-CM | POA: Diagnosis not present

## 2017-07-27 DIAGNOSIS — C50411 Malignant neoplasm of upper-outer quadrant of right female breast: Secondary | ICD-10-CM | POA: Diagnosis not present

## 2017-07-27 DIAGNOSIS — Z808 Family history of malignant neoplasm of other organs or systems: Secondary | ICD-10-CM | POA: Diagnosis not present

## 2017-07-27 DIAGNOSIS — Z170 Estrogen receptor positive status [ER+]: Secondary | ICD-10-CM | POA: Diagnosis not present

## 2017-07-27 DIAGNOSIS — Z87891 Personal history of nicotine dependence: Secondary | ICD-10-CM | POA: Diagnosis not present

## 2017-07-27 DIAGNOSIS — M8580 Other specified disorders of bone density and structure, unspecified site: Secondary | ICD-10-CM | POA: Diagnosis not present

## 2017-07-27 DIAGNOSIS — E039 Hypothyroidism, unspecified: Secondary | ICD-10-CM | POA: Diagnosis not present

## 2017-07-27 DIAGNOSIS — K219 Gastro-esophageal reflux disease without esophagitis: Secondary | ICD-10-CM | POA: Diagnosis not present

## 2017-07-27 DIAGNOSIS — Z800 Family history of malignant neoplasm of digestive organs: Secondary | ICD-10-CM | POA: Diagnosis not present

## 2017-07-28 ENCOUNTER — Ambulatory Visit
Admission: RE | Admit: 2017-07-28 | Discharge: 2017-07-28 | Disposition: A | Discharge status: Home / Self Care | Payer: 59 | Source: Ambulatory Visit | Attending: Radiation Oncology | Admitting: Radiation Oncology

## 2017-07-28 ENCOUNTER — Ambulatory Visit: Payer: 59

## 2017-07-28 DIAGNOSIS — E039 Hypothyroidism, unspecified: Secondary | ICD-10-CM | POA: Diagnosis not present

## 2017-07-28 DIAGNOSIS — M8580 Other specified disorders of bone density and structure, unspecified site: Secondary | ICD-10-CM | POA: Diagnosis not present

## 2017-07-28 DIAGNOSIS — C50411 Malignant neoplasm of upper-outer quadrant of right female breast: Secondary | ICD-10-CM | POA: Diagnosis not present

## 2017-07-28 DIAGNOSIS — Z170 Estrogen receptor positive status [ER+]: Secondary | ICD-10-CM | POA: Diagnosis not present

## 2017-07-28 DIAGNOSIS — Z808 Family history of malignant neoplasm of other organs or systems: Secondary | ICD-10-CM | POA: Diagnosis not present

## 2017-07-28 DIAGNOSIS — K219 Gastro-esophageal reflux disease without esophagitis: Secondary | ICD-10-CM | POA: Diagnosis not present

## 2017-07-28 DIAGNOSIS — Z510 Encounter for antineoplastic radiation therapy: Secondary | ICD-10-CM | POA: Diagnosis not present

## 2017-07-28 DIAGNOSIS — Z800 Family history of malignant neoplasm of digestive organs: Secondary | ICD-10-CM | POA: Diagnosis not present

## 2017-07-28 DIAGNOSIS — Z87891 Personal history of nicotine dependence: Secondary | ICD-10-CM | POA: Diagnosis not present

## 2017-07-31 ENCOUNTER — Ambulatory Visit
Admission: RE | Admit: 2017-07-31 | Discharge: 2017-07-31 | Disposition: A | Discharge status: Home / Self Care | Payer: 59 | Source: Ambulatory Visit | Attending: Radiation Oncology | Admitting: Radiation Oncology

## 2017-07-31 ENCOUNTER — Inpatient Hospital Stay: Payer: 59 | Attending: Radiation Oncology

## 2017-07-31 DIAGNOSIS — Z510 Encounter for antineoplastic radiation therapy: Secondary | ICD-10-CM | POA: Diagnosis not present

## 2017-07-31 DIAGNOSIS — Z170 Estrogen receptor positive status [ER+]: Secondary | ICD-10-CM | POA: Diagnosis not present

## 2017-07-31 DIAGNOSIS — Z800 Family history of malignant neoplasm of digestive organs: Secondary | ICD-10-CM | POA: Diagnosis not present

## 2017-07-31 DIAGNOSIS — K219 Gastro-esophageal reflux disease without esophagitis: Secondary | ICD-10-CM | POA: Diagnosis not present

## 2017-07-31 DIAGNOSIS — Z808 Family history of malignant neoplasm of other organs or systems: Secondary | ICD-10-CM | POA: Diagnosis not present

## 2017-07-31 DIAGNOSIS — Z87891 Personal history of nicotine dependence: Secondary | ICD-10-CM | POA: Diagnosis not present

## 2017-07-31 DIAGNOSIS — C50411 Malignant neoplasm of upper-outer quadrant of right female breast: Secondary | ICD-10-CM | POA: Diagnosis not present

## 2017-07-31 DIAGNOSIS — E039 Hypothyroidism, unspecified: Secondary | ICD-10-CM | POA: Diagnosis not present

## 2017-07-31 DIAGNOSIS — M8580 Other specified disorders of bone density and structure, unspecified site: Secondary | ICD-10-CM | POA: Diagnosis not present

## 2017-07-31 LAB — CBC
HCT: 38.3 % (ref 35.0–47.0)
Hemoglobin: 13.1 g/dL (ref 12.0–16.0)
MCH: 30.6 pg (ref 26.0–34.0)
MCHC: 34.3 g/dL (ref 32.0–36.0)
MCV: 89.3 fL (ref 80.0–100.0)
PLATELETS: 206 10*3/uL (ref 150–440)
RBC: 4.29 MIL/uL (ref 3.80–5.20)
RDW: 13.5 % (ref 11.5–14.5)
WBC: 4.3 10*3/uL (ref 3.6–11.0)

## 2017-08-01 ENCOUNTER — Ambulatory Visit
Admission: RE | Admit: 2017-08-01 | Discharge: 2017-08-01 | Disposition: A | Discharge status: Home / Self Care | Payer: 59 | Source: Ambulatory Visit | Attending: Radiation Oncology | Admitting: Radiation Oncology

## 2017-08-01 DIAGNOSIS — Z170 Estrogen receptor positive status [ER+]: Secondary | ICD-10-CM | POA: Diagnosis not present

## 2017-08-01 DIAGNOSIS — C50411 Malignant neoplasm of upper-outer quadrant of right female breast: Secondary | ICD-10-CM | POA: Diagnosis not present

## 2017-08-01 DIAGNOSIS — Z800 Family history of malignant neoplasm of digestive organs: Secondary | ICD-10-CM | POA: Diagnosis not present

## 2017-08-01 DIAGNOSIS — Z808 Family history of malignant neoplasm of other organs or systems: Secondary | ICD-10-CM | POA: Diagnosis not present

## 2017-08-01 DIAGNOSIS — Z87891 Personal history of nicotine dependence: Secondary | ICD-10-CM | POA: Diagnosis not present

## 2017-08-01 DIAGNOSIS — E039 Hypothyroidism, unspecified: Secondary | ICD-10-CM | POA: Diagnosis not present

## 2017-08-01 DIAGNOSIS — M8580 Other specified disorders of bone density and structure, unspecified site: Secondary | ICD-10-CM | POA: Diagnosis not present

## 2017-08-01 DIAGNOSIS — K219 Gastro-esophageal reflux disease without esophagitis: Secondary | ICD-10-CM | POA: Diagnosis not present

## 2017-08-01 DIAGNOSIS — Z510 Encounter for antineoplastic radiation therapy: Secondary | ICD-10-CM | POA: Diagnosis not present

## 2017-08-02 ENCOUNTER — Ambulatory Visit
Admission: RE | Admit: 2017-08-02 | Discharge: 2017-08-02 | Disposition: A | Discharge status: Home / Self Care | Payer: 59 | Source: Ambulatory Visit | Attending: Radiation Oncology | Admitting: Radiation Oncology

## 2017-08-02 DIAGNOSIS — Z170 Estrogen receptor positive status [ER+]: Secondary | ICD-10-CM | POA: Diagnosis not present

## 2017-08-02 DIAGNOSIS — Z808 Family history of malignant neoplasm of other organs or systems: Secondary | ICD-10-CM | POA: Diagnosis not present

## 2017-08-02 DIAGNOSIS — E039 Hypothyroidism, unspecified: Secondary | ICD-10-CM | POA: Diagnosis not present

## 2017-08-02 DIAGNOSIS — C50411 Malignant neoplasm of upper-outer quadrant of right female breast: Secondary | ICD-10-CM | POA: Diagnosis not present

## 2017-08-02 DIAGNOSIS — Z87891 Personal history of nicotine dependence: Secondary | ICD-10-CM | POA: Diagnosis not present

## 2017-08-02 DIAGNOSIS — M8580 Other specified disorders of bone density and structure, unspecified site: Secondary | ICD-10-CM | POA: Diagnosis not present

## 2017-08-02 DIAGNOSIS — Z510 Encounter for antineoplastic radiation therapy: Secondary | ICD-10-CM | POA: Diagnosis not present

## 2017-08-02 DIAGNOSIS — Z800 Family history of malignant neoplasm of digestive organs: Secondary | ICD-10-CM | POA: Diagnosis not present

## 2017-08-02 DIAGNOSIS — K219 Gastro-esophageal reflux disease without esophagitis: Secondary | ICD-10-CM | POA: Diagnosis not present

## 2017-08-03 ENCOUNTER — Ambulatory Visit
Admission: RE | Admit: 2017-08-03 | Discharge: 2017-08-03 | Disposition: A | Discharge status: Home / Self Care | Payer: 59 | Source: Ambulatory Visit | Attending: Radiation Oncology | Admitting: Radiation Oncology

## 2017-08-03 DIAGNOSIS — E039 Hypothyroidism, unspecified: Secondary | ICD-10-CM | POA: Diagnosis not present

## 2017-08-03 DIAGNOSIS — Z808 Family history of malignant neoplasm of other organs or systems: Secondary | ICD-10-CM | POA: Diagnosis not present

## 2017-08-03 DIAGNOSIS — C50411 Malignant neoplasm of upper-outer quadrant of right female breast: Secondary | ICD-10-CM | POA: Diagnosis not present

## 2017-08-03 DIAGNOSIS — Z800 Family history of malignant neoplasm of digestive organs: Secondary | ICD-10-CM | POA: Diagnosis not present

## 2017-08-03 DIAGNOSIS — K219 Gastro-esophageal reflux disease without esophagitis: Secondary | ICD-10-CM | POA: Diagnosis not present

## 2017-08-03 DIAGNOSIS — M8580 Other specified disorders of bone density and structure, unspecified site: Secondary | ICD-10-CM | POA: Diagnosis not present

## 2017-08-03 DIAGNOSIS — Z510 Encounter for antineoplastic radiation therapy: Secondary | ICD-10-CM | POA: Diagnosis not present

## 2017-08-03 DIAGNOSIS — Z87891 Personal history of nicotine dependence: Secondary | ICD-10-CM | POA: Diagnosis not present

## 2017-08-03 DIAGNOSIS — Z170 Estrogen receptor positive status [ER+]: Secondary | ICD-10-CM | POA: Diagnosis not present

## 2017-08-04 ENCOUNTER — Ambulatory Visit
Admission: RE | Admit: 2017-08-04 | Discharge: 2017-08-04 | Disposition: A | Discharge status: Home / Self Care | Payer: 59 | Source: Ambulatory Visit | Attending: Radiation Oncology | Admitting: Radiation Oncology

## 2017-08-04 DIAGNOSIS — K219 Gastro-esophageal reflux disease without esophagitis: Secondary | ICD-10-CM | POA: Diagnosis not present

## 2017-08-04 DIAGNOSIS — Z170 Estrogen receptor positive status [ER+]: Secondary | ICD-10-CM | POA: Diagnosis not present

## 2017-08-04 DIAGNOSIS — C50411 Malignant neoplasm of upper-outer quadrant of right female breast: Secondary | ICD-10-CM | POA: Diagnosis not present

## 2017-08-04 DIAGNOSIS — Z510 Encounter for antineoplastic radiation therapy: Secondary | ICD-10-CM | POA: Diagnosis not present

## 2017-08-04 DIAGNOSIS — E039 Hypothyroidism, unspecified: Secondary | ICD-10-CM | POA: Diagnosis not present

## 2017-08-04 DIAGNOSIS — Z87891 Personal history of nicotine dependence: Secondary | ICD-10-CM | POA: Diagnosis not present

## 2017-08-04 DIAGNOSIS — Z800 Family history of malignant neoplasm of digestive organs: Secondary | ICD-10-CM | POA: Diagnosis not present

## 2017-08-04 DIAGNOSIS — M8580 Other specified disorders of bone density and structure, unspecified site: Secondary | ICD-10-CM | POA: Diagnosis not present

## 2017-08-04 DIAGNOSIS — Z808 Family history of malignant neoplasm of other organs or systems: Secondary | ICD-10-CM | POA: Diagnosis not present

## 2017-08-07 ENCOUNTER — Ambulatory Visit
Admission: RE | Admit: 2017-08-07 | Discharge: 2017-08-07 | Disposition: A | Discharge status: Home / Self Care | Payer: 59 | Source: Ambulatory Visit | Attending: Radiation Oncology | Admitting: Radiation Oncology

## 2017-08-07 ENCOUNTER — Ambulatory Visit: Payer: Self-pay | Admitting: Hematology and Oncology

## 2017-08-07 DIAGNOSIS — Z510 Encounter for antineoplastic radiation therapy: Secondary | ICD-10-CM | POA: Diagnosis not present

## 2017-08-07 DIAGNOSIS — Z170 Estrogen receptor positive status [ER+]: Secondary | ICD-10-CM | POA: Diagnosis not present

## 2017-08-07 DIAGNOSIS — C50411 Malignant neoplasm of upper-outer quadrant of right female breast: Secondary | ICD-10-CM | POA: Diagnosis not present

## 2017-08-07 DIAGNOSIS — K219 Gastro-esophageal reflux disease without esophagitis: Secondary | ICD-10-CM | POA: Diagnosis not present

## 2017-08-07 DIAGNOSIS — Z87891 Personal history of nicotine dependence: Secondary | ICD-10-CM | POA: Diagnosis not present

## 2017-08-07 DIAGNOSIS — M8580 Other specified disorders of bone density and structure, unspecified site: Secondary | ICD-10-CM | POA: Diagnosis not present

## 2017-08-07 DIAGNOSIS — E039 Hypothyroidism, unspecified: Secondary | ICD-10-CM | POA: Diagnosis not present

## 2017-08-07 DIAGNOSIS — Z808 Family history of malignant neoplasm of other organs or systems: Secondary | ICD-10-CM | POA: Diagnosis not present

## 2017-08-07 DIAGNOSIS — Z800 Family history of malignant neoplasm of digestive organs: Secondary | ICD-10-CM | POA: Diagnosis not present

## 2017-08-08 ENCOUNTER — Ambulatory Visit
Admission: RE | Admit: 2017-08-08 | Discharge: 2017-08-08 | Disposition: A | Discharge status: Home / Self Care | Payer: 59 | Source: Ambulatory Visit | Attending: Radiation Oncology | Admitting: Radiation Oncology

## 2017-08-08 ENCOUNTER — Ambulatory Visit: Payer: 59

## 2017-08-08 DIAGNOSIS — E039 Hypothyroidism, unspecified: Secondary | ICD-10-CM | POA: Diagnosis not present

## 2017-08-08 DIAGNOSIS — Z800 Family history of malignant neoplasm of digestive organs: Secondary | ICD-10-CM | POA: Diagnosis not present

## 2017-08-08 DIAGNOSIS — Z87891 Personal history of nicotine dependence: Secondary | ICD-10-CM | POA: Diagnosis not present

## 2017-08-08 DIAGNOSIS — Z808 Family history of malignant neoplasm of other organs or systems: Secondary | ICD-10-CM | POA: Diagnosis not present

## 2017-08-08 DIAGNOSIS — Z510 Encounter for antineoplastic radiation therapy: Secondary | ICD-10-CM | POA: Diagnosis not present

## 2017-08-08 DIAGNOSIS — K219 Gastro-esophageal reflux disease without esophagitis: Secondary | ICD-10-CM | POA: Diagnosis not present

## 2017-08-08 DIAGNOSIS — M8580 Other specified disorders of bone density and structure, unspecified site: Secondary | ICD-10-CM | POA: Diagnosis not present

## 2017-08-08 DIAGNOSIS — C50411 Malignant neoplasm of upper-outer quadrant of right female breast: Secondary | ICD-10-CM | POA: Diagnosis not present

## 2017-08-08 DIAGNOSIS — Z170 Estrogen receptor positive status [ER+]: Secondary | ICD-10-CM | POA: Diagnosis not present

## 2017-08-09 ENCOUNTER — Ambulatory Visit
Admission: RE | Admit: 2017-08-09 | Discharge: 2017-08-09 | Disposition: A | Discharge status: Home / Self Care | Payer: 59 | Source: Ambulatory Visit | Attending: Radiation Oncology | Admitting: Radiation Oncology

## 2017-08-09 ENCOUNTER — Encounter: Payer: Self-pay | Admitting: *Deleted

## 2017-08-09 DIAGNOSIS — Z87891 Personal history of nicotine dependence: Secondary | ICD-10-CM | POA: Diagnosis not present

## 2017-08-09 DIAGNOSIS — C50411 Malignant neoplasm of upper-outer quadrant of right female breast: Secondary | ICD-10-CM | POA: Diagnosis not present

## 2017-08-09 DIAGNOSIS — Z510 Encounter for antineoplastic radiation therapy: Secondary | ICD-10-CM | POA: Diagnosis not present

## 2017-08-09 DIAGNOSIS — K219 Gastro-esophageal reflux disease without esophagitis: Secondary | ICD-10-CM | POA: Diagnosis not present

## 2017-08-09 DIAGNOSIS — Z808 Family history of malignant neoplasm of other organs or systems: Secondary | ICD-10-CM | POA: Diagnosis not present

## 2017-08-09 DIAGNOSIS — Z800 Family history of malignant neoplasm of digestive organs: Secondary | ICD-10-CM | POA: Diagnosis not present

## 2017-08-09 DIAGNOSIS — M8580 Other specified disorders of bone density and structure, unspecified site: Secondary | ICD-10-CM | POA: Diagnosis not present

## 2017-08-09 DIAGNOSIS — E039 Hypothyroidism, unspecified: Secondary | ICD-10-CM | POA: Diagnosis not present

## 2017-08-09 DIAGNOSIS — Z170 Estrogen receptor positive status [ER+]: Secondary | ICD-10-CM | POA: Diagnosis not present

## 2017-08-14 ENCOUNTER — Ambulatory Visit (HOSPITAL_BASED_OUTPATIENT_CLINIC_OR_DEPARTMENT_OTHER): Payer: 59 | Admitting: Hematology and Oncology

## 2017-08-14 ENCOUNTER — Telehealth: Payer: Self-pay

## 2017-08-14 ENCOUNTER — Encounter: Payer: Self-pay | Admitting: Hematology and Oncology

## 2017-08-14 DIAGNOSIS — Z170 Estrogen receptor positive status [ER+]: Secondary | ICD-10-CM | POA: Diagnosis not present

## 2017-08-14 DIAGNOSIS — C50411 Malignant neoplasm of upper-outer quadrant of right female breast: Secondary | ICD-10-CM | POA: Diagnosis not present

## 2017-08-14 MED ORDER — ANASTROZOLE 1 MG PO TABS: 1.0000 mg | ORAL_TABLET | Freq: Every day | ORAL | 3 refills | Status: DC

## 2017-08-14 NOTE — Progress Notes (Signed)
Patient Care Team: Harlan Stains, MD as PCP - General (Family Medicine) Rolm Bookbinder, MD as Consulting Physician (General Surgery) Nicholas Lose, MD as Consulting Physician (Hematology and Oncology) Gery Pray, MD as Consulting Physician (Radiation Oncology)  DIAGNOSIS:  Encounter Diagnosis  Name Primary?  . Malignant neoplasm of upper-outer quadrant of right breast in female, estrogen receptor positive ([place])     SUMMARY OF ONCOLOGIC HISTORY:   Malignant neoplasm of upper-outer quadrant of right breast in female, estrogen receptor positive ([place])   05/03/2017 Initial Diagnosis    Screening detected right breast calcifications 7 mm, right axilla negative, tumor biopsy grade 3 IDC with DCIS plus LCIS ER 100%, PR 2%, HER-2 negative ratio 1.45, Ki-67 25%, T1 BN 0 stage IA clinical stage      05/29/2017 Surgery    Right lumpectomy: IDC grade 2, 1.8 cm, DCIS intermediate grade, margins negative, 0/2 lymph nodes negative, ER 100%, PR 2%, HER-2 negative ratio 1.45, Ki-67 25%, T1c N0 stage IA      06/06/2017 Oncotype testing    Oncotype DX score 20:13% risk of recurrence      07/05/2017 - 08/09/2017 Radiation Therapy    Adj XRT at [facility] COMPLIANT: Follow-up after adjuvant radiation  INTERVAL HISTORY: Diana Jacobson is a 61 year old with above-mentioned is right breast cancer treated with lumpectomy and radiation therapy. She is currently here to discuss starting antiestrogen therapy. She tolerated radiation extremely well. She had mild radiation dermatitis.  REVIEW OF SYSTEMS:   Constitutional: Denies fevers, chills or abnormal weight loss Eyes: Denies blurriness of vision Ears, nose, mouth, throat, and face: Denies mucositis or sore throat Respiratory: Denies cough, dyspnea or wheezes Cardiovascular: Denies palpitation, chest discomfort Gastrointestinal:  Denies nausea, heartburn or change in bowel habits Skin: Denies abnormal skin rashes Lymphatics:  Denies new lymphadenopathy or easy bruising Neurological:Denies numbness, tingling or new weaknesses Behavioral/Psych: Mood is stable, no new changes  Extremities: No lower extremity edema Breast:  Mild radiation dermatitis All other systems were reviewed with the patient and are negative.  I have reviewed the past medical history, past surgical history, social history and family history with the patient and they are unchanged from previous note.  ALLERGIES:  has No Known Allergies.  MEDICATIONS:  Current Outpatient Prescriptions  Medication Sig Dispense Refill  . anastrozole (ARIMIDEX) 1 MG tablet Take 1 tablet (1 mg total) by mouth daily. 90 tablet 3  . Bioflavonoid Products (ESTER-C) 500-200-60 MG TABS Take 1 tablet by mouth daily.    . Calcium 500-125 MG-UNIT TABS Take 2 tablets by mouth daily.    Marland Kitchen glucosamine-chondroitin 500-400 MG tablet Take 1 tablet by mouth daily.    Marland Kitchen levocetirizine (XYZAL) 5 MG tablet Take 1 tablet (5 mg total) by mouth every evening. 30 tablet 12  . levothyroxine (SYNTHROID, LEVOTHROID) 88 MCG tablet Take 88 mcg by mouth daily before breakfast.    . Multiple Vitamin (MULTIVITAMIN) tablet Take 1 tablet by mouth daily.    . NON FORMULARY Take 1 tablet by mouth daily. DIGEST ZEN TABLET     No current facility-administered medications for this visit.     PHYSICAL EXAMINATION: ECOG PERFORMANCE STATUS: 1 - Symptomatic but completely ambulatory  Vitals:   08/14/17 1426  BP: (!) 108/42  Pulse: 67  Resp: 20  Temp: 98.3 F (36.8 C)  SpO2: 100%   Filed Weights   08/14/17 1426  Weight: 159 lb 9.6 oz (72.4 kg)    GENERAL:alert, no  distress and comfortable SKIN: skin color, texture, turgor are normal, no rashes or significant lesions EYES: normal, Conjunctiva are pink and non-injected, sclera clear OROPHARYNX:no exudate, no erythema and lips, buccal mucosa, and tongue normal  NECK: supple, thyroid normal size, non-tender, without nodularity LYMPH:  no  palpable lymphadenopathy in the cervical, axillary or inguinal LUNGS: clear to auscultation and percussion with normal breathing effort HEART: regular rate & rhythm and no murmurs and no lower extremity edema ABDOMEN:abdomen soft, non-tender and normal bowel sounds MUSCULOSKELETAL:no cyanosis of digits and no clubbing  NEURO: alert & oriented x 3 with fluent speech, no focal motor/sensory deficits EXTREMITIES: No lower extremity edema  LABORATORY DATA:  I have reviewed the data as listed   Chemistry      Component Value Date/Time   NA 140 05/10/2017 1230   K 4.2 05/10/2017 1230   CO2 28 05/10/2017 1230   BUN 15.5 05/10/2017 1230   CREATININE 0.8 05/10/2017 1230      Component Value Date/Time   CALCIUM 9.8 05/10/2017 1230   ALKPHOS 75 05/10/2017 1230   AST 18 05/10/2017 1230   ALT 14 05/10/2017 1230   BILITOT 0.49 05/10/2017 1230       Lab Results  Component Value Date   WBC 4.3 07/31/2017   HGB 13.1 07/31/2017   HCT 38.3 07/31/2017   MCV 89.3 07/31/2017   PLT 206 07/31/2017   NEUTROABS 2.8 05/10/2017    ASSESSMENT & PLAN:  Malignant neoplasm of upper-outer quadrant of right breast in female, estrogen receptor positive ([place]) 05/29/2017 Right lumpectomy: IDC grade 2, 1.8 cm, DCIS intermediate grade, margins negative, 0/2 lymph nodes negative, ER 100%, PR 2%, HER-2 negative ratio 1.45, Ki-67 25%, T1c N0 stage IA  Pathology counseling: I discussed the final pathology report of the patient provided  a copy of this report. I discussed the margins as well as lymph node surgeries. We also discussed the final staging along with previously performed ER/PR and HER-2/neu testing. Oncotype DX testing reveals score of 20; risk of recurrence 13%, no role of chemotherapy  Treatment plan: 1. Adjuvant radiation therapy at [facility] Completed 08/09/2017 2. Adjuvant antiestrogen therapy  Anastrozole counseling: We discussed the risks and benefits of anti-estrogen therapy with aromatase  inhibitors. These include but not limited to insomnia, hot flashes, mood changes, vaginal dryness, bone density loss, and weight gain. We strongly believe that the benefits far outweigh the risks. Patient understands these risks and consented to starting treatment. Planned treatment duration is 5-7 years.  Return to clinic in 3 months for survivorship care plan was that. Mendel Ryder will evaluate her side effects. I will like to see her once a year after that.  I spent 25 minutes talking to the patient of which more than half was spent in counseling and coordination of care.  No orders of the defined types were placed in this encounter.  The patient has a good understanding of the overall plan. she agrees with it. she will call with any problems that may develop before the next visit here.   Rulon Eisenmenger, MD 08/14/17

## 2017-08-14 NOTE — Telephone Encounter (Signed)
appts made and avs printed for patient 

## 2017-08-14 NOTE — Assessment & Plan Note (Signed)
05/29/2017 Right lumpectomy: IDC grade 2, 1.8 cm, DCIS intermediate grade, margins negative, 0/2 lymph nodes negative, ER 100%, PR 2%, HER-2 negative ratio 1.45, Ki-67 25%, T1c N0 stage IA  Pathology counseling: I discussed the final pathology report of the patient provided  a copy of this report. I discussed the margins as well as lymph node surgeries. We also discussed the final staging along with previously performed ER/PR and HER-2/neu testing. Oncotype DX testing reveals score of 20; risk of recurrence 13%, no role of chemotherapy  Treatment plan: 1. Adjuvant radiation therapy at [facility] 2. Adjuvant antiestrogen therapy  Anastrozole counseling: We discussed the risks and benefits of anti-estrogen therapy with aromatase inhibitors. These include but not limited to insomnia, hot flashes, mood changes, vaginal dryness, bone density loss, and weight gain. We strongly believe that the benefits far outweigh the risks. Patient understands these risks and consented to starting treatment. Planned treatment duration is 5-7 years.

## 2017-08-31 DIAGNOSIS — L661 Lichen planopilaris: Secondary | ICD-10-CM | POA: Diagnosis not present

## 2017-08-31 DIAGNOSIS — R202 Paresthesia of skin: Secondary | ICD-10-CM | POA: Diagnosis not present

## 2017-08-31 MED FILL — CLOBETASOL 0.05% OINTMENT: 0.05 | 14 days supply | Qty: 15 | Fill #0

## 2017-08-31 MED FILL — CLOBETASOL 0.05% SOLUTION: 0.05 | 30 days supply | Qty: 50 | Fill #0

## 2017-09-04 ENCOUNTER — Ambulatory Visit
Admission: RE | Admit: 2017-09-04 | Discharge: 2017-09-04 | Disposition: A | Discharge status: Home / Self Care | Payer: 59 | Source: Ambulatory Visit | Attending: Radiation Oncology | Admitting: Radiation Oncology

## 2017-09-04 ENCOUNTER — Encounter: Payer: Self-pay | Admitting: Radiation Oncology

## 2017-09-04 VITALS — Temp 97.5°F | Wt 159.0 lb

## 2017-09-04 DIAGNOSIS — Z170 Estrogen receptor positive status [ER+]: Secondary | ICD-10-CM | POA: Diagnosis not present

## 2017-09-04 DIAGNOSIS — Z79811 Long term (current) use of aromatase inhibitors: Secondary | ICD-10-CM | POA: Diagnosis not present

## 2017-09-04 DIAGNOSIS — C50411 Malignant neoplasm of upper-outer quadrant of right female breast: Secondary | ICD-10-CM | POA: Insufficient documentation

## 2017-09-04 DIAGNOSIS — Z923 Personal history of irradiation: Secondary | ICD-10-CM | POA: Diagnosis not present

## 2017-09-04 NOTE — Progress Notes (Signed)
Radiation Oncology Follow up Note  Name: Diana Jacobson   Date:   09/04/2017 MRN:  [id number] DOB: [birth year]    This 61 y.o. female presents to the clinic today for one-month follow-up status post whole breast radiation to her right breast for stage IA ER positive invasive mammary carcinoma.  REFERRING PROVIDER: Harlan Stains, MD  HPI: Patient is a 61 year old female now out 1 month having completed whole breast radiation to her right breast status post wide local excision for ER positive PR borderline HER-2/neu not overexpressed stage I invasive mammary carcinoma. She had a low risk of recurrence by Oncotype DX. Seen today in routine follow-up she is doing well still numb in areas a surgical excision which we would expect. She specifically denies breast tenderness cough or bone pain. She has started on. Arimadex and tolerating that well without side effect.  COMPLICATIONS OF TREATMENT: none  FOLLOW UP COMPLIANCE: keeps appointments   PHYSICAL EXAM:  Temp (!) 97.5 F (36.4 C)   Wt 158 lb 15.2 oz (72.1 kg)   BMI 26.45 kg/m  Lungs are clear to A&P cardiac examination essentially unremarkable with regular rate and rhythm. No dominant mass or nodularity is noted in either breast in 2 positions examined. Incision is well-healed. No axillary or supraclavicular adenopathy is appreciated. Cosmetic result is good although there is some asymmetry to the breast pulling towards the right scar region. Well-developed well-nourished patient in NAD. HEENT reveals PERLA, EOMI, discs not visualized.  Oral cavity is clear. No oral mucosal lesions are identified. Neck is clear without evidence of cervical or supraclavicular adenopathy. Lungs are clear to A&P. Cardiac examination is essentially unremarkable with regular rate and rhythm without murmur rub or thrill. Abdomen is benign with no organomegaly or masses noted. Motor sensory and DTR levels are equal and symmetric in the upper and lower extremities.  Cranial nerves II through XII are grossly intact. Proprioception is intact. No peripheral adenopathy or edema is identified. No motor or sensory levels are noted. Crude visual fields are within normal range.  RADIOLOGY RESULTS: No current films for review  PLAN: Present time patient is doing well 1 month out from radiation. I'm please were overall progress. She has started on arimadex. I have asked to see her back in 4-5 months for follow-up. I've assured her some of the numbness and hyperpigmentation of skin will resolve over time. Patient knows to call with any concerns.  I would like to take this opportunity to thank you for allowing me to participate in the care of your patient.Armstead Peaks., MD

## 2017-09-12 ENCOUNTER — Ambulatory Visit: Payer: Self-pay | Admitting: Physician Assistant

## 2017-09-12 ENCOUNTER — Other Ambulatory Visit
Admission: RE | Admit: 2017-09-12 | Discharge: 2017-09-12 | Disposition: A | Discharge status: Home / Self Care | Payer: 59 | Source: Ambulatory Visit | Attending: Physician Assistant | Admitting: Physician Assistant

## 2017-09-12 ENCOUNTER — Encounter: Payer: Self-pay | Admitting: Physician Assistant

## 2017-09-12 VITALS — BP 110/84 | HR 76 | Temp 98.4°F

## 2017-09-12 DIAGNOSIS — L659 Nonscarring hair loss, unspecified: Secondary | ICD-10-CM | POA: Insufficient documentation

## 2017-09-12 DIAGNOSIS — Z79899 Other long term (current) drug therapy: Secondary | ICD-10-CM | POA: Insufficient documentation

## 2017-09-12 DIAGNOSIS — Z5181 Encounter for therapeutic drug level monitoring: Secondary | ICD-10-CM | POA: Diagnosis not present

## 2017-09-12 DIAGNOSIS — E039 Hypothyroidism, unspecified: Secondary | ICD-10-CM | POA: Insufficient documentation

## 2017-09-12 DIAGNOSIS — M791 Myalgia, unspecified site: Secondary | ICD-10-CM

## 2017-09-12 DIAGNOSIS — N644 Mastodynia: Secondary | ICD-10-CM

## 2017-09-12 LAB — HEPATIC FUNCTION PANEL
ALBUMIN: 4.1 g/dL (ref 3.5–5.0)
ALT: 15 U/L (ref 14–54)
AST: 21 U/L (ref 15–41)
Alkaline Phosphatase: 59 U/L (ref 38–126)
Bilirubin, Direct: <0.1 mg/dL — ABNORMAL LOW (ref 0.1–0.5)
TOTAL PROTEIN: 7.2 g/dL (ref 6.5–8.1)
Total Bilirubin: 0.8 mg/dL (ref 0.3–1.2)

## 2017-09-12 LAB — LIPID PANEL
CHOLESTEROL: 181 mg/dL (ref 0–200)
HDL: 75 mg/dL (ref >40)
LDL CALC: 93 mg/dL (ref 0–99)
TRIGLYCERIDES: 67 mg/dL (ref <150)
Total CHOL/HDL Ratio: 2.4 RATIO
VLDL: 13 mg/dL (ref 0–40)

## 2017-09-12 LAB — TSH: TSH: 1.902 u[IU]/mL (ref 0.350–4.500)

## 2017-09-12 NOTE — Progress Notes (Signed)
S: c/o sore area under left arm around to breast, is on arimidex post radiation for breast cancer, started medication about a month ago, 2 weeks ago the area became sore and has worsened, denies fever, chills, cp/sob, states her blood pressure has been low a few times when they checked it with the automatic bp machine, then when they did it by hand it was normal, feels her heart beating faster at night when she lies on her left side, no palpitations, also having some hair loss, hx of hypothyroidism  O: vitals wnl, bp 110/84, pulse is 76; lungs c t a, cv rrr, left peck and lateral area under axilla are tender, no redness swelling or rash noted in this area, n/v intact  A: hair loss, myalgia and mastalgia secondary to medication  P: lab to check tsh, t3, t4, liver panel for dermatologist

## 2017-09-13 ENCOUNTER — Encounter: Payer: Self-pay | Admitting: Physician Assistant

## 2017-09-13 LAB — T4: T4 TOTAL: 7.6 ug/dL (ref 4.5–12.0)

## 2017-09-13 LAB — T3 UPTAKE: T3 Uptake Ratio: 29 % (ref 24–39)

## 2017-10-13 DIAGNOSIS — Z79899 Other long term (current) drug therapy: Secondary | ICD-10-CM | POA: Diagnosis not present

## 2017-10-13 DIAGNOSIS — D8989 Other specified disorders involving the immune mechanism, not elsewhere classified: Secondary | ICD-10-CM | POA: Diagnosis not present

## 2017-11-15 NOTE — Progress Notes (Signed)
CLINIC:  Survivorship   REASON FOR VISIT:  Routine follow-up post-treatment for a recent history of breast cancer.  BRIEF ONCOLOGIC HISTORY:    Malignant neoplasm of upper-outer quadrant of right breast in female, estrogen receptor positive ([place])   05/03/2017 Initial Diagnosis    Screening detected right breast calcifications 7 mm, right axilla negative, tumor biopsy grade 3 IDC with DCIS plus LCIS ER 100%, PR 2%, HER-2 negative ratio 1.45, Ki-67 25%, T1 BN 0 stage IA clinical stage      05/29/2017 Surgery    Right lumpectomy: IDC grade 2, 1.8 cm, DCIS intermediate grade, margins negative, 0/2 lymph nodes negative, ER 100%, PR 2%, HER-2 negative ratio 1.45, Ki-67 25%, T1c N0 stage IA      06/06/2017 Oncotype testing    Oncotype DX score 20:13% risk of recurrence      07/05/2017 - 08/09/2017 Radiation Therapy    Adj XRT at [facility]      08/2017 -  Anti-estrogen oral therapy    Anastrozole daily       INTERVAL HISTORY:  Ms. Lesniewski presents to the [facility] today for our initial meeting to review her survivorship care plan detailing her treatment course for breast cancer, as well as monitoring long-term side effects of that treatment, education regarding health maintenance, screening, and overall wellness and health promotion.     Overall, Ms. Goar reports feeling quite well.  She is taking Anastrozole daily.  She denies any challenges.  She notes she is slightly more moody.  She is taking Plaquenil and thinks that may be contributing to the moodiness as well.      REVIEW OF SYSTEMS:  Review of Systems  Constitutional: Negative for appetite change, chills, fatigue, fever and unexpected weight change.  HENT:   Negative for hearing loss, lump/mass and sore throat.   Eyes: Negative for eye problems and icterus.  Respiratory: Negative for chest tightness, cough and shortness of breath.   Cardiovascular: Negative for chest pain, leg swelling and palpitations.    Gastrointestinal: Negative for abdominal distention, abdominal pain, constipation, diarrhea, nausea and vomiting.  Endocrine: Negative for hot flashes.  Skin: Negative for itching and rash.  Neurological: Negative for dizziness, extremity weakness and numbness.  Hematological: Negative for adenopathy. Does not bruise/bleed easily.  Psychiatric/Behavioral: Negative for depression. The patient is not nervous/anxious.   Breast: Denies any new nodularity, masses, tenderness, nipple changes, or nipple discharge.      ONCOLOGY TREATMENT TEAM:  1. Surgeon:  Dr. Donne Hazel at [facility] 2. Medical Oncologist: Dr. Lindi Adie  3. Radiation Oncologist: Dr. Baruch Gouty    PAST MEDICAL/SURGICAL HISTORY:  Past Medical History:  Diagnosis Date  . Cancer ([place]) 04/2017   right breast cancer  . GERD (gastroesophageal reflux disease)   . Hypothyroidism   . Osteopenia    Past Surgical History:  Procedure Laterality Date  . BREAST LUMPECTOMY WITH RADIOACTIVE SEED AND SENTINEL LYMPH NODE BIOPSY Right 05/29/2017   Procedure: BREAST LUMPECTOMY WITH RADIOACTIVE SEED AND SENTINEL LYMPH NODE BIOPSY;  Surgeon: Rolm Bookbinder, MD;  Location: [place] [facility];  Service: General;  Laterality: Right;  . ROTATOR CUFF REPAIR    . TONSILLECTOMY       ALLERGIES:  No Known Allergies   CURRENT MEDICATIONS:  Outpatient Encounter Medications as of 11/16/2017  Medication Sig  . anastrozole (ARIMIDEX) 1 MG tablet Take 1 tablet (1 mg total) by mouth daily.  Marland Kitchen Bioflavonoid Products (ESTER-C) 500-200-60 MG TABS Take 1 tablet by mouth daily.  Marland Kitchen  Calcium 500-125 MG-UNIT TABS Take 2 tablets by mouth daily.  . clobetasol (TEMOVATE) 0.05 % external solution   . clobetasol ointment (TEMOVATE) 0.05 %   . glucosamine-chondroitin 500-400 MG tablet Take 1 tablet by mouth daily.  . hydroxychloroquine (PLAQUENIL) 200 MG tablet   . levocetirizine (XYZAL) 5 MG tablet Take 1 tablet (5 mg total) by mouth  every evening.  Marland Kitchen levothyroxine (SYNTHROID, LEVOTHROID) 88 MCG tablet Take 88 mcg by mouth daily before breakfast.  . Multiple Vitamin (MULTIVITAMIN) tablet Take 1 tablet by mouth daily.  . NON FORMULARY Take 1 tablet by mouth daily. DIGEST ZEN TABLET   No facility-administered encounter medications on file as of 11/16/2017.      ONCOLOGIC FAMILY HISTORY:  Family History  Problem Relation Age of Onset  . Lung cancer Mother   . Cancer Mother 17       Lung  . Colon cancer Father   . Cancer Father 51       Stomach or Colon  . Colon cancer Maternal Grandmother   . Cancer Maternal Grandmother 60       Colon  . Cancer Cousin 40       Colon     GENETIC COUNSELING/TESTING: Not at this time  SOCIAL HISTORY:  CAYLE CORDOBA is married and lives with her husband in [place], [place].  She has 4 children and they live in [place] and [place].  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  She denies any current or history of tobacco, alcohol, or illicit drug use.     PHYSICAL EXAMINATION:  Vital Signs:   Vitals:   11/16/17 1230  BP: 122/78  Pulse: 79  Resp: 18  Temp: 98.9 F (37.2 C)  SpO2: 99%   Filed Weights   11/16/17 1230  Weight: 154 lb 8 oz (70.1 kg)   General: Well-nourished, well-appearing female in no acute distress.  She is unaccompanied today.   HEENT: Head is normocephalic.  Pupils equal and reactive to light. Conjunctivae clear without exudate.  Sclerae anicteric. Oral mucosa is pink, moist.  Oropharynx is pink without lesions or erythema.  Lymph: No cervical, supraclavicular, or infraclavicular lymphadenopathy noted on palpation.  Cardiovascular: Regular rate and rhythm.Marland Kitchen Respiratory: Clear to auscultation bilaterally. Chest expansion symmetric; breathing non-labored.  Breasts: right breast s/p lumpectomy with mild amt of scar tissue, hyperpigmentation, no nodularity, left breast without nodules, masses, skin or  nipple changes GI: Abdomen soft and round; non-tender, non-distended. Bowel sounds normoactive.  GU: Deferred.  Neuro: No focal deficits. Steady gait.  Psych: Mood and affect normal and appropriate for situation.  Extremities: No edema. MSK: No focal spinal tenderness to palpation.  Full range of motion in bilateral upper extremities Skin: Warm and dry.  LABORATORY DATA:  None for this visit.  DIAGNOSTIC IMAGING:  None for this visit.      ASSESSMENT AND PLAN:  Ms.. Deutschman is a pleasant 61 y.o. female with Stage IA right breast invasive ductal carcinoma, ER+/PR+/HER2-, diagnosed in 04/2017, treated with lumpectomy, adjuvant radiation therapy, and anti-estrogen therapy with Anastrozole daily beginning in 08/2017.  She presents to the Survivorship Clinic for our initial meeting and routine follow-up post-completion of treatment for breast cancer.    1. Stage IA right breast cancer:  Ms. Poulton is continuing to recover from definitive treatment for breast cancer. She will follow-up with her medical oncologist, Dr. Lindi Adie. Today, a comprehensive survivorship care plan and treatment summary was reviewed with  the patient today detailing her breast cancer diagnosis, treatment course, potential late/long-term effects of treatment, appropriate follow-up care with recommendations for the future, and patient education resources.  A copy of this summary, along with a letter will be sent to the patient's primary care provider via mail/fax/In Basket message after today's visit.    2. Bone health:  Given Ms. Dorvil's age/history of breast cancer and her current treatment regimen including anti-estrogen therapy with Anastrozole, she is at risk for bone demineralization.  She has undergone bone density testing at [facility], however we do not have a copy of this report.  We have requested them fax [place] these results today. I will determine when we can order repeat bone density based on the [place] reports. In the  meantime, she was encouraged to increase her consumption of foods rich in calcium, as well as increase her weight-bearing activities.  She was given education on specific activities to promote bone health.  3. Cancer screening:  Due to Ms. Boomershine's history and her age, she should receive screening for skin cancers, colon cancer, and gynecologic cancers.  The information and recommendations are listed on the patient's comprehensive care plan/treatment summary and were reviewed in detail with the patient.    4. Health maintenance and wellness promotion: Ms. Grieshop was encouraged to consume 5-7 servings of fruits and vegetables per day. We reviewed the "Nutrition Rainbow" handout, as well as the handout "Take Control of Your Health and Reduce Your Cancer Risk" from the [facility].  She was also encouraged to engage in moderate to vigorous exercise for 30 minutes per day most days of the week. We discussed the [employer] fitness program, which is designed for cancer survivors to help them become more physically fit after cancer treatments.  She was instructed to limit her alcohol consumption and continue to abstain from tobacco use.     5. Support services/counseling: It is not uncommon for this period of the patient's cancer care trajectory to be one of many emotions and stressors.  We discussed an opportunity for her to participate in the next session of [facility] ("Finding Your New Normal") support group series designed for patients after they have completed treatment.   Ms. Flicker was encouraged to take advantage of our many other support services programs, support groups, and/or counseling in coping with her new life as a cancer survivor after completing anti-cancer treatment.  She was offered support today through active listening and expressive supportive counseling.  She was given information regarding our available services and encouraged to contact me with any questions or for help  enrolling in any of our support group/programs.    Dispo:   -Return to cancer center for f/u with Dr. Lindi Adie in11/2019  -Mammogram due in 04/2018 -Follow up with Dr. Donne Hazel in 04/2018 -She is welcome to return back to the Survivorship Clinic at any time; no additional follow-up needed at this time.  -Consider referral back to survivorship as a long-term survivor for continued surveillance  A total of (30) minutes of face-to-face time was spent with this patient with greater than 50% of that time in counseling and care-coordination.   Gardenia Phlegm, NP Survivorship Program [facility] [phone]   Note: PRIMARY CARE PROVIDER Harlan Stains, [place] [phone]

## 2017-11-16 ENCOUNTER — Ambulatory Visit (HOSPITAL_BASED_OUTPATIENT_CLINIC_OR_DEPARTMENT_OTHER): Payer: 59 | Admitting: Adult Health

## 2017-11-16 ENCOUNTER — Encounter: Payer: Self-pay | Admitting: Adult Health

## 2017-11-16 VITALS — BP 122/78 | HR 79 | Temp 98.9°F | Resp 18 | Ht 65.0 in | Wt 154.5 lb

## 2017-11-16 DIAGNOSIS — C50411 Malignant neoplasm of upper-outer quadrant of right female breast: Secondary | ICD-10-CM

## 2017-11-16 DIAGNOSIS — Z170 Estrogen receptor positive status [ER+]: Secondary | ICD-10-CM

## 2017-11-16 DIAGNOSIS — Z79811 Long term (current) use of aromatase inhibitors: Secondary | ICD-10-CM

## 2017-11-22 DIAGNOSIS — Z800 Family history of malignant neoplasm of digestive organs: Secondary | ICD-10-CM | POA: Diagnosis not present

## 2017-11-22 DIAGNOSIS — K635 Polyp of colon: Secondary | ICD-10-CM | POA: Diagnosis not present

## 2017-11-22 DIAGNOSIS — K5730 Diverticulosis of large intestine without perforation or abscess without bleeding: Secondary | ICD-10-CM | POA: Diagnosis not present

## 2017-12-21 DIAGNOSIS — L821 Other seborrheic keratosis: Secondary | ICD-10-CM | POA: Diagnosis not present

## 2017-12-21 DIAGNOSIS — Z23 Encounter for immunization: Secondary | ICD-10-CM | POA: Diagnosis not present

## 2017-12-21 DIAGNOSIS — D225 Melanocytic nevi of trunk: Secondary | ICD-10-CM | POA: Diagnosis not present

## 2017-12-21 DIAGNOSIS — R202 Paresthesia of skin: Secondary | ICD-10-CM | POA: Diagnosis not present

## 2017-12-21 DIAGNOSIS — L661 Lichen planopilaris: Secondary | ICD-10-CM | POA: Diagnosis not present

## 2017-12-21 DIAGNOSIS — D2271 Melanocytic nevi of right lower limb, including hip: Secondary | ICD-10-CM | POA: Diagnosis not present

## 2018-01-04 DIAGNOSIS — E039 Hypothyroidism, unspecified: Secondary | ICD-10-CM | POA: Diagnosis not present

## 2018-01-04 DIAGNOSIS — M8580 Other specified disorders of bone density and structure, unspecified site: Secondary | ICD-10-CM | POA: Diagnosis not present

## 2018-01-04 DIAGNOSIS — Z170 Estrogen receptor positive status [ER+]: Secondary | ICD-10-CM | POA: Diagnosis not present

## 2018-01-04 DIAGNOSIS — E063 Autoimmune thyroiditis: Secondary | ICD-10-CM | POA: Diagnosis not present

## 2018-01-04 DIAGNOSIS — J301 Allergic rhinitis due to pollen: Secondary | ICD-10-CM | POA: Diagnosis not present

## 2018-01-04 DIAGNOSIS — Z0000 Encounter for general adult medical examination without abnormal findings: Secondary | ICD-10-CM | POA: Diagnosis not present

## 2018-01-04 DIAGNOSIS — C50411 Malignant neoplasm of upper-outer quadrant of right female breast: Secondary | ICD-10-CM | POA: Diagnosis not present

## 2018-01-04 DIAGNOSIS — Z13220 Encounter for screening for lipoid disorders: Secondary | ICD-10-CM | POA: Diagnosis not present

## 2018-02-08 ENCOUNTER — Other Ambulatory Visit: Payer: Self-pay

## 2018-02-08 ENCOUNTER — Ambulatory Visit
Admission: RE | Admit: 2018-02-08 | Discharge: 2018-02-08 | Disposition: A | Discharge status: Home / Self Care | Payer: 59 | Source: Ambulatory Visit | Attending: Radiation Oncology | Admitting: Radiation Oncology

## 2018-02-08 VITALS — BP 120/75 | HR 83 | Temp 97.1°F | Resp 12 | Ht 65.0 in | Wt 156.5 lb

## 2018-02-08 DIAGNOSIS — Z923 Personal history of irradiation: Secondary | ICD-10-CM | POA: Insufficient documentation

## 2018-02-08 DIAGNOSIS — Z79811 Long term (current) use of aromatase inhibitors: Secondary | ICD-10-CM | POA: Diagnosis not present

## 2018-02-08 DIAGNOSIS — Z170 Estrogen receptor positive status [ER+]: Secondary | ICD-10-CM | POA: Insufficient documentation

## 2018-02-08 DIAGNOSIS — C50411 Malignant neoplasm of upper-outer quadrant of right female breast: Secondary | ICD-10-CM | POA: Diagnosis not present

## 2018-02-08 NOTE — Progress Notes (Signed)
Radiation Oncology Follow up Note  Name: Diana Jacobson   Date:   02/08/2018 MRN:  [id number] DOB: September 01, 1956    This 62 y.o. female presents to the clinic today for six-month follow-up status post whole breast radiation to her right breast for stage IA ER/PR positive invasive mammary carcinoma.  REFERRING PROVIDER: Harlan Stains, MD  HPI: Patient is a 62 year old female now out 6 months having completed whole breast radiation to her right breast for stage Ia ER positive PR borderline invasive mammary carcinoma status post wide local excision. She is seen today in routine follow up is doing well she states her white blood cells have been low and is curious about that. She specifically denies breast tenderness cough or bone pain.. She is scheduled for mammograms I believe in a month. She's currently on arimadex tolerating that well without side effect.  COMPLICATIONS OF TREATMENT: none  FOLLOW UP COMPLIANCE: keeps appointments   PHYSICAL EXAM:  BP 120/75 (BP Location: Left Arm, Patient Position: Sitting, Cuff Size: Normal)   Pulse 83   Temp (!) 97.1 F (36.2 C)   Resp 12   Ht 5\' 5"  (1.651 m)   Wt 156 lb 8.4 oz (71 kg)   BMI 26.05 kg/m  Right breast is status post wide local excision. No dominant mass or nodularity is noted in either breast in 2 positions examined. She does have some retraction of the scar although cosmetic result is still good. No supraclavicular or axillary adenopathy is noted bilaterally. Well-developed well-nourished patient in NAD. HEENT reveals PERLA, EOMI, discs not visualized.  Oral cavity is clear. No oral mucosal lesions are identified. Neck is clear without evidence of cervical or supraclavicular adenopathy. Lungs are clear to A&P. Cardiac examination is essentially unremarkable with regular rate and rhythm without murmur rub or thrill. Abdomen is benign with no organomegaly or masses noted. Motor sensory and DTR levels are equal and symmetric in the upper and  lower extremities. Cranial nerves II through XII are grossly intact. Proprioception is intact. No peripheral adenopathy or edema is identified. No motor or sensory levels are noted. Crude visual fields are within normal range.  RADIOLOGY RESULTS: No current films for review  PLAN: Present time she continues to do well with no evidence of disease. I've tractor WBCs back over the past year there always in the 4 range which was within normal limits. I've asked to see her back in 6 months for follow-up. She continues on arimadex without side effect. Patient is to call with any concerns at any time.  I would like to take this opportunity to thank you for allowing me to participate in the care of your patient.Noreene Filbert, MD

## 2018-03-26 ENCOUNTER — Other Ambulatory Visit: Payer: Self-pay | Admitting: *Deleted

## 2018-03-26 ENCOUNTER — Inpatient Hospital Stay
Admission: RE | Admit: 2018-03-26 | Discharge: 2018-03-26 | Disposition: A | Discharge status: Home / Self Care | Payer: Self-pay | Source: Ambulatory Visit | Attending: *Deleted | Admitting: *Deleted

## 2018-03-26 DIAGNOSIS — Z9289 Personal history of other medical treatment: Secondary | ICD-10-CM

## 2018-03-27 ENCOUNTER — Other Ambulatory Visit: Payer: Self-pay | Admitting: Adult Health

## 2018-03-27 DIAGNOSIS — Z170 Estrogen receptor positive status [ER+]: Principal | ICD-10-CM

## 2018-03-27 DIAGNOSIS — C50411 Malignant neoplasm of upper-outer quadrant of right female breast: Secondary | ICD-10-CM

## 2018-04-02 ENCOUNTER — Other Ambulatory Visit: Payer: Self-pay | Admitting: Family Medicine

## 2018-04-02 DIAGNOSIS — M8588 Other specified disorders of bone density and structure, other site: Secondary | ICD-10-CM

## 2018-04-25 DIAGNOSIS — L661 Lichen planopilaris: Secondary | ICD-10-CM | POA: Diagnosis not present

## 2018-04-25 DIAGNOSIS — Z5181 Encounter for therapeutic drug level monitoring: Secondary | ICD-10-CM | POA: Diagnosis not present

## 2018-04-27 ENCOUNTER — Ambulatory Visit
Admission: RE | Admit: 2018-04-27 | Discharge: 2018-04-27 | Disposition: A | Discharge status: Home / Self Care | Payer: 59 | Source: Ambulatory Visit | Attending: Adult Health | Admitting: Adult Health

## 2018-04-27 DIAGNOSIS — Z170 Estrogen receptor positive status [ER+]: Secondary | ICD-10-CM | POA: Diagnosis not present

## 2018-04-27 DIAGNOSIS — C50411 Malignant neoplasm of upper-outer quadrant of right female breast: Secondary | ICD-10-CM

## 2018-04-27 DIAGNOSIS — R928 Other abnormal and inconclusive findings on diagnostic imaging of breast: Secondary | ICD-10-CM | POA: Diagnosis not present

## 2018-04-27 DIAGNOSIS — Z853 Personal history of malignant neoplasm of breast: Secondary | ICD-10-CM | POA: Insufficient documentation

## 2018-04-27 HISTORY — DX: Personal history of irradiation: Z92.3

## 2018-04-27 HISTORY — DX: Malignant neoplasm of unspecified site of unspecified female breast: C50.919

## 2018-05-03 ENCOUNTER — Other Ambulatory Visit: Payer: Self-pay

## 2018-05-29 DIAGNOSIS — L661 Lichen planopilaris: Secondary | ICD-10-CM | POA: Diagnosis not present

## 2018-06-05 ENCOUNTER — Ambulatory Visit
Admission: RE | Admit: 2018-06-05 | Discharge: 2018-06-05 | Disposition: A | Discharge status: Home / Self Care | Payer: 59 | Source: Ambulatory Visit | Attending: Family Medicine | Admitting: Family Medicine

## 2018-06-05 DIAGNOSIS — Z780 Asymptomatic menopausal state: Secondary | ICD-10-CM | POA: Diagnosis not present

## 2018-06-05 DIAGNOSIS — M8588 Other specified disorders of bone density and structure, other site: Secondary | ICD-10-CM | POA: Diagnosis not present

## 2018-06-05 DIAGNOSIS — M8589 Other specified disorders of bone density and structure, multiple sites: Secondary | ICD-10-CM | POA: Diagnosis not present

## 2018-07-05 DIAGNOSIS — C50411 Malignant neoplasm of upper-outer quadrant of right female breast: Secondary | ICD-10-CM | POA: Diagnosis not present

## 2018-07-06 DIAGNOSIS — Z23 Encounter for immunization: Secondary | ICD-10-CM | POA: Diagnosis not present

## 2018-08-13 ENCOUNTER — Other Ambulatory Visit: Payer: Self-pay | Admitting: Hematology and Oncology

## 2018-08-14 ENCOUNTER — Other Ambulatory Visit: Payer: Self-pay | Admitting: Hematology and Oncology

## 2018-08-29 DIAGNOSIS — L661 Lichen planopilaris: Secondary | ICD-10-CM | POA: Diagnosis not present

## 2018-10-16 DIAGNOSIS — J0100 Acute maxillary sinusitis, unspecified: Secondary | ICD-10-CM | POA: Diagnosis not present

## 2018-10-22 NOTE — Progress Notes (Signed)
Patient Care Team: Harlan Stains, MD as PCP - General (Family Medicine) Rolm Bookbinder, MD as Consulting Physician (General Surgery) Nicholas Lose, MD as Consulting Physician (Hematology and Oncology) Gery Pray, MD as Consulting Physician (Radiation Oncology) Gardenia Phlegm, NP as Nurse Practitioner (Hematology and Oncology)  DIAGNOSIS: Right breast cancer grade 3 IDC with DCIS  SUMMARY OF ONCOLOGIC HISTORY:   Malignant neoplasm of upper-outer quadrant of right breast in female, estrogen receptor positive ([place])   05/03/2017 Initial Diagnosis    Screening detected right breast calcifications 7 mm, right axilla negative, tumor biopsy grade 3 IDC with DCIS plus LCIS ER 100%, PR 2%, HER-2 negative ratio 1.45, Ki-67 25%, T1 BN 0 stage IA clinical stage    05/29/2017 Surgery    Right lumpectomy: IDC grade 2, 1.8 cm, DCIS intermediate grade, margins negative, 0/2 lymph nodes negative, ER 100%, PR 2%, HER-2 negative ratio 1.45, Ki-67 25%, T1c N0 stage IA    06/06/2017 Oncotype testing    Oncotype DX score 20:13% risk of recurrence    07/05/2017 - 08/09/2017 Radiation Therapy    Adj XRT at [place]    08/2017 -  Anti-estrogen oral therapy    Anastrozole daily     CHIEF COMPLIANT: Follow up on anastrozole therapy   INTERVAL HISTORY: Diana Jacobson is a 62 y.o. with above-mentioned right breast cancer treated with lumpectomy and radiation therapy. She is currently on antiestrogen therapy with Anastrozole. She notes she is doing well overall. She notes she is tolerating Anastrozole with mild hot flashes and mild myalgia and sleep disruption. These are similar to her prior menopausal symptoms. She has had improvement in her symptoms with CBD oil 24m. She needs a refill of her Anastrozole. She is no longer being followed by Dr. WDonne Hazel   Since her last visit she had a diagnostic mammogram on 04/27/18 which was negative for malignancy. She also had a Bone Density scan on  06/05/18 which shows she is osteopenia with lowest T-score at -2.0 of the right total femur. The probability of a major osteoporotic fracture is 8.1% within the next ten years. [facility]. She notes falling earlier this year which resulted in two broken ribs, so she is interested in bisphosphonate treatment with weekly oral Fosamax. She would like to remain active with [employer].   She notes she is off Plaquenil due to poor toleration and was started on Proscar for her alopecia by her dermatologist.  REVIEW OF SYSTEMS:  Constitutional: Denies fevers, chills or abnormal weight loss (+) mild hot flashes (+) sleep disruption  Eyes: Denies blurriness of vision Ears, nose, mouth, throat, and face: Denies mucositis or sore throat Respiratory: Denies cough, dyspnea or wheezes Cardiovascular: Denies palpitation, chest discomfort Gastrointestinal:  Denies nausea, heartburn or change in bowel habits Skin: Denies abnormal skin rashes MSK: (+) Mild myalgia  Lymphatics: Denies new lymphadenopathy or easy bruising Neurological:Denies numbness, tingling or new weaknesses Behavioral/Psych: Mood is stable, no new changes  Extremities: No lower extremity edema Breast: (+) Mild Tenderness in right breast/axilla   denies any pain or lumps or nodules in either breasts All other systems were reviewed with the patient and are negative.  I have reviewed the past medical history, past surgical history, social history and family history with the patient and they are unchanged from previous note.  ALLERGIES:  has No Known Allergies.  MEDICATIONS:  Current Outpatient Medications  Medication Sig Dispense Refill  . alendronate (FOSAMAX)  70 MG tablet Take 1 tablet (70 mg total) by mouth once a week. Take with a full glass of water on an empty stomach. 12 tablet 3  . anastrozole (ARIMIDEX) 1 MG tablet Take 1 tablet (1 mg total) by mouth daily. 90 tablet 3  .  Bioflavonoid Products (ESTER-C) 500-200-60 MG TABS Take 1 tablet by mouth daily.    . Calcium 500-125 MG-UNIT TABS Take 2 tablets by mouth daily.    . clobetasol (TEMOVATE) 0.05 % external solution   1  . clobetasol ointment (TEMOVATE) 0.05 %   0  . finasteride (PROSCAR) 5 MG tablet Take 0.5 tablets (2.5 mg total) by mouth daily.    Marland Kitchen glucosamine-chondroitin 500-400 MG tablet Take 1 tablet by mouth daily.    Marland Kitchen levocetirizine (XYZAL) 5 MG tablet Take 1 tablet (5 mg total) by mouth every evening. 30 tablet 12  . levothyroxine (SYNTHROID, LEVOTHROID) 88 MCG tablet Take 88 mcg by mouth daily before breakfast.    . Multiple Vitamin (MULTIVITAMIN) tablet Take 1 tablet by mouth daily.    . NON FORMULARY Take 1 tablet by mouth daily. DIGEST ZEN TABLET     No current facility-administered medications for this visit.     PHYSICAL EXAMINATION: ECOG PERFORMANCE STATUS: 1 - Symptomatic but completely ambulatory  Vitals:   10/23/18 0838  BP: 118/82  Pulse: 65  Resp: 17  Temp: 98 F (36.7 C)  SpO2: 100%   Filed Weights   10/23/18 0838  Weight: 161 lb (73 kg)    GENERAL:alert, no distress and comfortable SKIN: skin color, texture, turgor are normal, no rashes or significant lesions EYES: normal, Conjunctiva are pink and non-injected, sclera clear OROPHARYNX:no exudate, no erythema and lips, buccal mucosa, and tongue normal  NECK: supple, thyroid normal size, non-tender, without nodularity LYMPH:  no palpable lymphadenopathy in the cervical, axillary or inguinal LUNGS: clear to auscultation and percussion with normal breathing effort HEART: regular rate & rhythm and no murmurs and no lower extremity edema ABDOMEN:abdomen soft, non-tender and normal bowel sounds MUSCULOSKELETAL:no cyanosis of digits and no clubbing  NEURO: alert & oriented x 3 with fluent speech, no focal motor/sensory deficits EXTREMITIES: No lower extremity edema BREAST: No palpable masses or nodules in either right or  left breasts. No palpable axillary supraclavicular or infraclavicular adenopathy no breast tenderness or nipple discharge. (exam performed in the presence of a chaperone)  LABORATORY DATA:  I have reviewed the data as listed CMP Latest Ref Rng & Units 09/12/2017 05/10/2017  Glucose 70 - 140 mg/dl - 94  BUN 7.0 - 26.0 mg/dL - 15.5  Creatinine 0.6 - 1.1 mg/dL - 0.8  Sodium 136 - 145 mEq/L - 140  Potassium 3.5 - 5.1 mEq/L - 4.2  CO2 22 - 29 mEq/L - 28  Calcium 8.4 - 10.4 mg/dL - 9.8  Total Protein 6.5 - 8.1 g/dL 7.2 6.9  Total Bilirubin 0.3 - 1.2 mg/dL 0.8 0.49  Alkaline Phos 38 - 126 U/L 59 75  AST 15 - 41 U/L 21 18  ALT 14 - 54 U/L 15 14    Lab Results  Component Value Date   WBC 4.3 07/31/2017   HGB 13.1 07/31/2017   HCT 38.3 07/31/2017   MCV 89.3 07/31/2017   PLT 206 07/31/2017   NEUTROABS 2.8 05/10/2017    ASSESSMENT & PLAN:  Malignant neoplasm of upper-outer quadrant of right breast in female, estrogen receptor positive ([place]) 05/29/2017 Right lumpectomy: IDC grade 2, 1.8 cm, DCIS intermediate grade, margins  negative, 0/2 lymph nodes negative, ER 100%, PR 2%, HER-2 negative ratio 1.45, Ki-67 25%, T1c N0 stage IA  Oncotype DX testing reveals score of 20; risk of recurrence 13%, no role of chemotherapy Adjuvant radiation therapy at [facility] Completed 08/09/2017  Current Treatment: Anastrozole 1 mg daily started September 2018   Anastrozole toxicities: 1.  Hot flashes: Mild 2. myalgias: Mild Patient has been taking CBD oil and she has noticed an improvement in her symptoms  Breast cancer surveillance: 1.  Mammogram: 04/27/2018: No evidence of malignancy breast density category B 2. breast exam 10/23/2018: Benign  Bone density test 06/05/2018: T score -2 osteopenia  Recommendation: I recommended bisphosphonate therapy along with calcium and vitamin D, I sent a prescription for Fosamax.  Return to clinic in 1 year for follow-up    No orders of the defined types were  placed in this encounter.  The patient has a good understanding of the overall plan. she agrees with it. she will call with any problems that may develop before the next visit here.   Harriette Ohara, MD 10/23/2018   Oneal Deputy, am acting as scribe for Nicholas Lose, MD.  I have reviewed the above documentation for accuracy and completeness, and I agree with the above.

## 2018-10-23 ENCOUNTER — Inpatient Hospital Stay: Payer: 59 | Attending: Hematology and Oncology | Admitting: Hematology and Oncology

## 2018-10-23 ENCOUNTER — Telehealth: Payer: Self-pay | Admitting: Hematology and Oncology

## 2018-10-23 DIAGNOSIS — D0511 Intraductal carcinoma in situ of right breast: Secondary | ICD-10-CM | POA: Insufficient documentation

## 2018-10-23 DIAGNOSIS — Z79811 Long term (current) use of aromatase inhibitors: Secondary | ICD-10-CM | POA: Insufficient documentation

## 2018-10-23 DIAGNOSIS — Z923 Personal history of irradiation: Secondary | ICD-10-CM | POA: Insufficient documentation

## 2018-10-23 DIAGNOSIS — Z79899 Other long term (current) drug therapy: Secondary | ICD-10-CM | POA: Diagnosis not present

## 2018-10-23 DIAGNOSIS — C50411 Malignant neoplasm of upper-outer quadrant of right female breast: Secondary | ICD-10-CM

## 2018-10-23 DIAGNOSIS — Z170 Estrogen receptor positive status [ER+]: Secondary | ICD-10-CM | POA: Diagnosis not present

## 2018-10-23 MED ORDER — ANASTROZOLE 1 MG PO TABS: 1.0000 mg | ORAL_TABLET | Freq: Every day | ORAL | 3 refills | Status: DC

## 2018-10-23 MED ORDER — ALENDRONATE SODIUM 70 MG PO TABS: 70.0000 mg | ORAL_TABLET | ORAL | 3 refills | Status: DC

## 2018-10-23 MED ORDER — FINASTERIDE 5 MG PO TABS: 2.5000 mg | ORAL_TABLET | Freq: Every day | ORAL | Status: DC

## 2018-10-23 NOTE — Assessment & Plan Note (Addendum)
05/29/2017 Right lumpectomy: IDC grade 2, 1.8 cm, DCIS intermediate grade, margins negative, 0/2 lymph nodes negative, ER 100%, PR 2%, HER-2 negative ratio 1.45, Ki-67 25%, T1c N0 stage IA  Oncotype DX testing reveals score of 20; risk of recurrence 13%, no role of chemotherapy Adjuvant radiation therapy at [place] [facility] Completed 08/09/2017  Current Treatment: Anastrozole 1 mg daily started September 2018   Anastrozole toxicities: 1.  Hot flashes: Mild 2. myalgias: Mild Patient has been taking CBD oil and she has noticed an improvement in her symptoms  Breast cancer surveillance: 1.  Mammogram: 04/27/2018: No evidence of malignancy breast density category B 2. breast exam 10/23/2018: Benign  Bone density test 06/05/2018: T score -2 osteopenia  Recommendation: I recommended bisphosphonate therapy along with calcium and vitamin D, I sent a prescription for Fosamax.  Return to clinic in 1 year for follow-up

## 2018-10-23 NOTE — Telephone Encounter (Signed)
Gave pt avs and calendar  °

## 2018-10-25 ENCOUNTER — Encounter: Payer: Self-pay | Admitting: Hematology and Oncology

## 2018-11-06 DIAGNOSIS — Z0100 Encounter for examination of eyes and vision without abnormal findings: Secondary | ICD-10-CM | POA: Diagnosis not present

## 2018-11-06 DIAGNOSIS — L661 Lichen planopilaris: Secondary | ICD-10-CM | POA: Diagnosis not present

## 2019-01-17 DIAGNOSIS — L821 Other seborrheic keratosis: Secondary | ICD-10-CM | POA: Diagnosis not present

## 2019-01-17 DIAGNOSIS — Z23 Encounter for immunization: Secondary | ICD-10-CM | POA: Diagnosis not present

## 2019-01-17 DIAGNOSIS — L719 Rosacea, unspecified: Secondary | ICD-10-CM | POA: Diagnosis not present

## 2019-01-17 DIAGNOSIS — D2271 Melanocytic nevi of right lower limb, including hip: Secondary | ICD-10-CM | POA: Diagnosis not present

## 2019-01-17 DIAGNOSIS — L661 Lichen planopilaris: Secondary | ICD-10-CM | POA: Diagnosis not present

## 2019-01-17 DIAGNOSIS — D225 Melanocytic nevi of trunk: Secondary | ICD-10-CM | POA: Diagnosis not present

## 2019-02-07 ENCOUNTER — Ambulatory Visit: Payer: Self-pay | Admitting: Radiation Oncology

## 2019-02-27 ENCOUNTER — Other Ambulatory Visit: Payer: Self-pay

## 2019-02-27 ENCOUNTER — Ambulatory Visit (INDEPENDENT_AMBULATORY_CARE_PROVIDER_SITE_OTHER): Payer: 59 | Admitting: Podiatry

## 2019-02-27 ENCOUNTER — Encounter: Payer: Self-pay | Admitting: Podiatry

## 2019-02-27 VITALS — BP 118/74

## 2019-02-27 DIAGNOSIS — L600 Ingrowing nail: Secondary | ICD-10-CM

## 2019-02-27 MED ORDER — NEOMYCIN-POLYMYXIN-HC 3.5-10000-1 OT SOLN: OTIC | 1 refills | Status: DC

## 2019-02-27 NOTE — Patient Instructions (Signed)

## 2019-03-01 NOTE — Progress Notes (Signed)
Subjective:   Patient ID: Richard Miu, female   DOB: 63 y.o.   MRN: [id number]   HPI Patient presents with chronic ingrown toenail deformity hallux bilateral stating that that is been going on a long time and is increasingly hard for her to wear shoe gear.  Patient states she is tried to trim and soak without relief and she needs something in order to prevent them from reoccurring.  Patient does not smoke likes to be active   Review of Systems  All other systems reviewed and are negative.       Objective:  Physical Exam Vitals signs and nursing note reviewed.  Constitutional:      Appearance: She is well-developed.  Pulmonary:     Effort: Pulmonary effort is normal.  Musculoskeletal: Normal range of motion.  Skin:    General: Skin is warm.  Neurological:     Mental Status: She is alert.     Neurovascular status intact muscle strength adequate range of motion within normal limits with patient found to have incurvated hallux nail borders bilateral lateral border that are very painful when pressed make shoe gear difficult with obvious structural changes of the nailbed itself with no redness or drainage noted    Assessment:  Chronic ingrown toenail deformity hallux bilateral lateral borders with pain     Plan:  H&P reviewed condition discussed and at this point due to long-term nature and chronic deformity of the nails I recommended permanent nail procedure.  Patient wants surgery understanding this and at this point I allowed her to read consent form going over all possible complications and after reviewing she signed and today I infiltrated each hallux 60 mg like Marcaine mixture sterile prep applied to each big toe and using sterile instrumentation I remove the lateral borders exposed matrix and applied phenol 3 applications 36 followed by alcohol by sterile dressing gave instructions for soaks and encouraged to leave the dressing on 24 hours but to take it off earlier if start  throbbing and wrote prescription for drops.  Encouraged her to call with any questions concerns she may have

## 2019-03-13 ENCOUNTER — Ambulatory Visit: Payer: 59 | Admitting: Podiatry

## 2019-05-01 ENCOUNTER — Other Ambulatory Visit: Payer: Self-pay | Admitting: Family Medicine

## 2019-05-01 DIAGNOSIS — C50411 Malignant neoplasm of upper-outer quadrant of right female breast: Secondary | ICD-10-CM

## 2019-05-21 ENCOUNTER — Ambulatory Visit
Admission: RE | Admit: 2019-05-21 | Discharge: 2019-05-21 | Disposition: A | Discharge status: Home / Self Care | Payer: 59 | Source: Ambulatory Visit | Attending: Family Medicine | Admitting: Family Medicine

## 2019-05-21 ENCOUNTER — Other Ambulatory Visit: Payer: Self-pay

## 2019-05-21 DIAGNOSIS — Z853 Personal history of malignant neoplasm of breast: Secondary | ICD-10-CM | POA: Diagnosis not present

## 2019-05-21 DIAGNOSIS — C50411 Malignant neoplasm of upper-outer quadrant of right female breast: Secondary | ICD-10-CM | POA: Insufficient documentation

## 2019-05-21 DIAGNOSIS — R928 Other abnormal and inconclusive findings on diagnostic imaging of breast: Secondary | ICD-10-CM | POA: Diagnosis not present

## 2019-07-23 MED FILL — ALENDRONATE NA 70 MG TAB: 70 | 84 days supply | Qty: 12 | Fill #0

## 2019-08-09 MED FILL — ANASTROZOLE 1 MG TABLET: 1 | 90 days supply | Qty: 90 | Fill #0

## 2019-10-14 ENCOUNTER — Other Ambulatory Visit: Payer: Self-pay | Admitting: Hematology and Oncology

## 2019-10-14 MED FILL — ALENDRONATE NA 70 MG TAB: 70 | 84 days supply | Qty: 12 | Fill #0

## 2019-10-21 NOTE — Progress Notes (Signed)
Patient Care Team: Harlan Stains, MD as PCP - General (Family Medicine) Rolm Bookbinder, MD as Consulting Physician (General Surgery) Nicholas Lose, MD as Consulting Physician (Hematology and Oncology) Gery Pray, MD as Consulting Physician (Radiation Oncology) Gardenia Phlegm, NP as Nurse Practitioner (Hematology and Oncology)  DIAGNOSIS:    ICD-10-CM   1. Malignant neoplasm of upper-outer quadrant of right breast in female, estrogen receptor positive ([place])  C50.411    Z17.0     SUMMARY OF ONCOLOGIC HISTORY: Oncology History  Malignant neoplasm of upper-outer quadrant of right breast in female, estrogen receptor positive ([place])  05/03/2017 Initial Diagnosis   Screening detected right breast calcifications 7 mm, right axilla negative, tumor biopsy grade 3 IDC with DCIS plus LCIS ER 100%, PR 2%, HER-2 negative ratio 1.45, Ki-67 25%, T1 BN 0 stage IA clinical stage   05/29/2017 Surgery   Right lumpectomy: IDC grade 2, 1.8 cm, DCIS intermediate grade, margins negative, 0/2 lymph nodes negative, ER 100%, PR 2%, HER-2 negative ratio 1.45, Ki-67 25%, T1c N0 stage IA   06/06/2017 Oncotype testing   Oncotype DX score 20:13% risk of recurrence   07/05/2017 - 08/09/2017 Radiation Therapy   Adj XRT at [place]   08/2017 -  Anti-estrogen oral therapy   Anastrozole daily     CHIEF COMPLIANT: Follow-up of right breast cancer on anastrozole  INTERVAL HISTORY: Diana Jacobson is a 63 y.o. with above-mentioned history of right breast cancer treated with lumpectomy, radiation therapy, and who is currently on antiestrogen therapy with anastrozole. Mammogram on 05/21/19 showed no evidence of malignancy bilaterally. She presents to the clinic today for annual follow-up.  She has occasional aches and pains and occasional hot flashes but otherwise doing quite well.  Denies any lumps or nodules in the breast.  REVIEW OF SYSTEMS:   Constitutional: Denies fevers, chills or abnormal weight loss  Eyes: Denies blurriness of vision Ears, nose, mouth, throat, and face: Denies mucositis or sore throat Respiratory: Denies cough, dyspnea or wheezes Cardiovascular: Denies palpitation, chest discomfort Gastrointestinal: Denies nausea, heartburn or change in bowel habits Skin: Denies abnormal skin rashes Lymphatics: Denies new lymphadenopathy or easy bruising Neurological: Denies numbness, tingling or new weaknesses Behavioral/Psych: Mood is stable, no new changes  Extremities: No lower extremity edema Breast: denies any pain or lumps or nodules in either breasts All other systems were reviewed with the patient and are negative.  I have reviewed the past medical history, past surgical history, social history and family history with the patient and they are unchanged from previous note.  ALLERGIES:  has No Known Allergies.  MEDICATIONS:  Current Outpatient Medications  Medication Sig Dispense Refill  . alendronate (FOSAMAX) 70 MG tablet TAKE 1 TABLET (70 MG TOTAL) BY MOUTH ONCE A WEEK. TAKE WITH A FULL GLASS OF WATER ON AN EMPTY STOMACH. 12 tablet 0  . anastrozole (ARIMIDEX) 1 MG tablet Take 1 tablet (1 mg total) by mouth daily. 90 tablet 3  . Bioflavonoid Products (ESTER-C) 500-200-60 MG TABS Take 1 tablet by mouth daily.    . Calcium 500-125 MG-UNIT TABS Take 2 tablets by mouth daily.    . clobetasol (TEMOVATE) 0.05 % external solution   1  . clobetasol ointment (TEMOVATE) 0.05 %   0  . finasteride (PROSCAR) 5 MG tablet Take 0.5 tablets (2.5 mg total) by mouth daily.    . fluocinonide (LIDEX) 0.05 % external solution   1  . glucosamine-chondroitin 500-400 MG tablet Take 1 tablet by mouth daily.    Marland Kitchen  levocetirizine (XYZAL) 5 MG tablet Take 1 tablet (5 mg total) by mouth every evening. 30 tablet 12  . levothyroxine (SYNTHROID, LEVOTHROID) 88 MCG tablet Take 88 mcg by mouth daily before breakfast.    . Multiple Vitamin (MULTIVITAMIN) tablet Take 1 tablet by mouth daily.    Marland Kitchen  neomycin-polymyxin-hydrocortisone (CORTISPORIN) OTIC solution Apply 1-2 drops to toe after soaking BID 10 mL 1  . NON FORMULARY Take 1 tablet by mouth daily. DIGEST ZEN TABLET     No current facility-administered medications for this visit.     PHYSICAL EXAMINATION: ECOG PERFORMANCE STATUS: 1 - Symptomatic but completely ambulatory  Vitals:   10/22/19 0836  BP: 130/70  Pulse: 65  Resp: 17  Temp: 98.3 F (36.8 C)  SpO2: 100%   Filed Weights   10/22/19 0836  Weight: 159 lb 3.2 oz (72.2 kg)    GENERAL: alert, no distress and comfortable SKIN: skin color, texture, turgor are normal, no rashes or significant lesions EYES: normal, Conjunctiva are pink and non-injected, sclera clear OROPHARYNX: no exudate, no erythema and lips, buccal mucosa, and tongue normal  NECK: supple, thyroid normal size, non-tender, without nodularity LYMPH: no palpable lymphadenopathy in the cervical, axillary or inguinal LUNGS: clear to auscultation and percussion with normal breathing effort HEART: regular rate & rhythm and no murmurs and no lower extremity edema ABDOMEN: abdomen soft, non-tender and normal bowel sounds MUSCULOSKELETAL: no cyanosis of digits and no clubbing  NEURO: alert & oriented x 3 with fluent speech, no focal motor/sensory deficits EXTREMITIES: No lower extremity edema BREAST: No palpable masses or nodules in either right or left breasts. No palpable axillary supraclavicular or infraclavicular adenopathy no breast tenderness or nipple discharge. (exam performed in the presence of a chaperone)  LABORATORY DATA:  I have reviewed the data as listed CMP Latest Ref Rng & Units 09/12/2017 05/10/2017  Glucose 70 - 140 mg/dl - 94  BUN 7.0 - 26.0 mg/dL - 15.5  Creatinine 0.6 - 1.1 mg/dL - 0.8  Sodium 136 - 145 mEq/L - 140  Potassium 3.5 - 5.1 mEq/L - 4.2  CO2 22 - 29 mEq/L - 28  Calcium 8.4 - 10.4 mg/dL - 9.8  Total Protein 6.5 - 8.1 g/dL 7.2 6.9  Total Bilirubin 0.3 - 1.2 mg/dL 0.8 0.49   Alkaline Phos 38 - 126 U/L 59 75  AST 15 - 41 U/L 21 18  ALT 14 - 54 U/L 15 14    Lab Results  Component Value Date   WBC 4.3 07/31/2017   HGB 13.1 07/31/2017   HCT 38.3 07/31/2017   MCV 89.3 07/31/2017   PLT 206 07/31/2017   NEUTROABS 2.8 05/10/2017    ASSESSMENT & PLAN:  Malignant neoplasm of upper-outer quadrant of right breast in female, estrogen receptor positive ([place]) 05/29/2017 Right lumpectomy: IDC grade 2, 1.8 cm, DCIS intermediate grade, margins negative, 0/2 lymph nodes negative, ER 100%, PR 2%, HER-2 negative ratio 1.45, Ki-67 25%, T1c N0 stage IA  Oncotype DX testing reveals score of 20; risk of recurrence 13%, no role of chemotherapy [place]  Current Treatment: Anastrozole 1 mg daily started September 2018   Anastrozole toxicities: 1.  Hot flashes: Mild 2. myalgias: Mild Patient has been taking CBD oil and she has noticed an improvement in her symptoms  Breast cancer surveillance: 1.  Mammogram: 05/21/2019 no evidence of malignancy breast density category B 2. breast exam 10/22/2019: Benign 3.  Bone density 06/05/2018: T score -2: Osteopenia  Recommendation: Fosamax  along with calcium and vitamin D. Patient stays very active by playing tennis at the [employer] courts  Return to clinic in 1 year for follow-up    No orders of the defined types were placed in this encounter.  The patient has a good understanding of the overall plan. she agrees with it. she will call with any problems that may develop before the next visit here.  Nicholas Lose, MD 10/22/2019  Julious Oka Dorshimer am acting as scribe for Dr. Nicholas Lose.  I have reviewed the above documentation for accuracy and completeness, and I agree with the above.

## 2019-10-22 ENCOUNTER — Other Ambulatory Visit: Payer: Self-pay

## 2019-10-22 ENCOUNTER — Inpatient Hospital Stay: Payer: 59 | Attending: Hematology and Oncology | Admitting: Hematology and Oncology

## 2019-10-22 ENCOUNTER — Telehealth: Payer: Self-pay | Admitting: Hematology and Oncology

## 2019-10-22 DIAGNOSIS — M8580 Other specified disorders of bone density and structure, unspecified site: Secondary | ICD-10-CM | POA: Insufficient documentation

## 2019-10-22 DIAGNOSIS — Z170 Estrogen receptor positive status [ER+]: Secondary | ICD-10-CM | POA: Diagnosis not present

## 2019-10-22 DIAGNOSIS — N951 Menopausal and female climacteric states: Secondary | ICD-10-CM | POA: Insufficient documentation

## 2019-10-22 DIAGNOSIS — Z79811 Long term (current) use of aromatase inhibitors: Secondary | ICD-10-CM | POA: Diagnosis not present

## 2019-10-22 DIAGNOSIS — C50411 Malignant neoplasm of upper-outer quadrant of right female breast: Secondary | ICD-10-CM | POA: Diagnosis not present

## 2019-10-22 MED ORDER — ALENDRONATE SODIUM 70 MG PO TABS: 70.0000 mg | ORAL_TABLET | ORAL | 0 refills | Status: DC

## 2019-10-22 MED ORDER — ANASTROZOLE 1 MG PO TABS: 1.0000 mg | ORAL_TABLET | Freq: Every day | ORAL | 3 refills | Status: DC

## 2019-10-22 NOTE — Assessment & Plan Note (Signed)
05/29/2017 Right lumpectomy: IDC grade 2, 1.8 cm, DCIS intermediate grade, margins negative, 0/2 lymph nodes negative, ER 100%, PR 2%, HER-2 negative ratio 1.45, Ki-67 25%, T1c N0 stage IA  Oncotype DX testing reveals score of 20; risk of recurrence 13%, no role of chemotherapy [place] [place]  Current Treatment: Anastrozole 1 mg daily started September 2018   Anastrozole toxicities: 1.  Hot flashes: Mild 2. myalgias: Mild Patient has been taking CBD oil and she has noticed an improvement in her symptoms  Breast cancer surveillance: 1.  Mammogram: 05/21/2019 no evidence of malignancy breast density category B 2. breast exam 10/22/2019: Benign 3.  Bone density 06/05/2018: T score -2: Osteopenia  Recommendation: Fosamax along with calcium and vitamin D.  Return to clinic in 1 year for follow-up

## 2019-10-22 NOTE — Telephone Encounter (Signed)
I talk with patient regarding schedule  

## 2019-11-06 ENCOUNTER — Other Ambulatory Visit: Payer: Self-pay | Admitting: *Deleted

## 2019-11-06 MED ORDER — ANASTROZOLE 1 MG PO TABS: 1.0000 mg | ORAL_TABLET | Freq: Every day | ORAL | 3 refills | Status: DC

## 2019-11-06 MED FILL — ANASTROZOLE 1 MG TABLET: 1 | 90 days supply | Qty: 90 | Fill #0

## 2019-11-08 MED FILL — SYNTHROID 88 MCG TABLET: 88 | 90 days supply | Qty: 90 | Fill #0

## 2019-11-13 DIAGNOSIS — C50411 Malignant neoplasm of upper-outer quadrant of right female breast: Secondary | ICD-10-CM | POA: Diagnosis not present

## 2019-11-13 DIAGNOSIS — E039 Hypothyroidism, unspecified: Secondary | ICD-10-CM | POA: Diagnosis not present

## 2019-11-13 DIAGNOSIS — M8588 Other specified disorders of bone density and structure, other site: Secondary | ICD-10-CM | POA: Diagnosis not present

## 2019-11-13 DIAGNOSIS — E063 Autoimmune thyroiditis: Secondary | ICD-10-CM | POA: Diagnosis not present

## 2019-11-13 DIAGNOSIS — Z0000 Encounter for general adult medical examination without abnormal findings: Secondary | ICD-10-CM | POA: Diagnosis not present

## 2019-11-13 DIAGNOSIS — Z13220 Encounter for screening for lipoid disorders: Secondary | ICD-10-CM | POA: Diagnosis not present

## 2019-11-13 DIAGNOSIS — N393 Stress incontinence (female) (male): Secondary | ICD-10-CM | POA: Diagnosis not present

## 2019-11-21 DIAGNOSIS — M79672 Pain in left foot: Secondary | ICD-10-CM | POA: Diagnosis not present

## 2019-11-21 DIAGNOSIS — R269 Unspecified abnormalities of gait and mobility: Secondary | ICD-10-CM | POA: Diagnosis not present

## 2019-11-21 DIAGNOSIS — M79671 Pain in right foot: Secondary | ICD-10-CM | POA: Diagnosis not present

## 2019-11-21 MED FILL — SYNTHROID 88 MCG TABLET: 88 | 90 days supply | Qty: 90 | Fill #0

## 2019-11-28 DIAGNOSIS — R269 Unspecified abnormalities of gait and mobility: Secondary | ICD-10-CM | POA: Diagnosis not present

## 2019-11-28 DIAGNOSIS — M79672 Pain in left foot: Secondary | ICD-10-CM | POA: Diagnosis not present

## 2019-11-28 DIAGNOSIS — M79671 Pain in right foot: Secondary | ICD-10-CM | POA: Diagnosis not present

## 2019-12-05 DIAGNOSIS — R269 Unspecified abnormalities of gait and mobility: Secondary | ICD-10-CM | POA: Diagnosis not present

## 2019-12-05 DIAGNOSIS — M79672 Pain in left foot: Secondary | ICD-10-CM | POA: Diagnosis not present

## 2019-12-05 DIAGNOSIS — M79671 Pain in right foot: Secondary | ICD-10-CM | POA: Diagnosis not present

## 2019-12-11 DIAGNOSIS — R269 Unspecified abnormalities of gait and mobility: Secondary | ICD-10-CM | POA: Diagnosis not present

## 2019-12-11 DIAGNOSIS — M79672 Pain in left foot: Secondary | ICD-10-CM | POA: Diagnosis not present

## 2019-12-11 DIAGNOSIS — M79671 Pain in right foot: Secondary | ICD-10-CM | POA: Diagnosis not present

## 2019-12-19 DIAGNOSIS — M79672 Pain in left foot: Secondary | ICD-10-CM | POA: Diagnosis not present

## 2019-12-19 DIAGNOSIS — R269 Unspecified abnormalities of gait and mobility: Secondary | ICD-10-CM | POA: Diagnosis not present

## 2019-12-19 DIAGNOSIS — M79671 Pain in right foot: Secondary | ICD-10-CM | POA: Diagnosis not present

## 2019-12-26 DIAGNOSIS — M79671 Pain in right foot: Secondary | ICD-10-CM | POA: Diagnosis not present

## 2019-12-26 DIAGNOSIS — M79672 Pain in left foot: Secondary | ICD-10-CM | POA: Diagnosis not present

## 2019-12-26 DIAGNOSIS — R269 Unspecified abnormalities of gait and mobility: Secondary | ICD-10-CM | POA: Diagnosis not present

## 2020-01-02 MED FILL — ALENDRONATE NA 70 MG TAB: 70 | 84 days supply | Qty: 12 | Fill #0

## 2020-01-03 ENCOUNTER — Other Ambulatory Visit: Payer: Self-pay | Admitting: *Deleted

## 2020-01-03 MED ORDER — ALENDRONATE SODIUM 70 MG PO TABS: 70.0000 mg | ORAL_TABLET | ORAL | 0 refills | Status: DC

## 2020-01-31 DIAGNOSIS — L700 Acne vulgaris: Secondary | ICD-10-CM | POA: Diagnosis not present

## 2020-01-31 DIAGNOSIS — D2271 Melanocytic nevi of right lower limb, including hip: Secondary | ICD-10-CM | POA: Diagnosis not present

## 2020-01-31 DIAGNOSIS — D225 Melanocytic nevi of trunk: Secondary | ICD-10-CM | POA: Diagnosis not present

## 2020-01-31 DIAGNOSIS — L661 Lichen planopilaris: Secondary | ICD-10-CM | POA: Diagnosis not present

## 2020-01-31 DIAGNOSIS — Z23 Encounter for immunization: Secondary | ICD-10-CM | POA: Diagnosis not present

## 2020-01-31 DIAGNOSIS — L821 Other seborrheic keratosis: Secondary | ICD-10-CM | POA: Diagnosis not present

## 2020-02-05 MED FILL — ANASTROZOLE 1 MG TABLET: 1 | 90 days supply | Qty: 90 | Fill #1

## 2020-02-13 MED FILL — SYNTHROID 88 MCG TABLET: 88 | 90 days supply | Qty: 90 | Fill #1

## 2020-03-26 DIAGNOSIS — L661 Lichen planopilaris: Secondary | ICD-10-CM | POA: Diagnosis not present

## 2020-03-26 DIAGNOSIS — L821 Other seborrheic keratosis: Secondary | ICD-10-CM | POA: Diagnosis not present

## 2020-03-30 MED FILL — ALENDRONATE NA 70 MG TAB: 70 | 84 days supply | Qty: 12 | Fill #0

## 2020-03-31 ENCOUNTER — Other Ambulatory Visit: Payer: Self-pay

## 2020-03-31 MED ORDER — ALENDRONATE SODIUM 70 MG PO TABS: 70.0000 mg | ORAL_TABLET | ORAL | 0 refills | Status: DC

## 2020-04-20 ENCOUNTER — Other Ambulatory Visit: Payer: Self-pay | Admitting: Family Medicine

## 2020-04-20 DIAGNOSIS — C50411 Malignant neoplasm of upper-outer quadrant of right female breast: Secondary | ICD-10-CM

## 2020-04-20 MED FILL — ANASTROZOLE 1 MG TABLET: 1 | 90 days supply | Qty: 90 | Fill #2

## 2020-05-12 MED FILL — SYNTHROID 88 MCG TABLET: 88 | 90 days supply | Qty: 90 | Fill #2

## 2020-05-28 ENCOUNTER — Ambulatory Visit
Admission: RE | Admit: 2020-05-28 | Discharge: 2020-05-28 | Disposition: A | Discharge status: Home / Self Care | Payer: 59 | Source: Ambulatory Visit | Attending: Family Medicine | Admitting: Family Medicine

## 2020-05-28 ENCOUNTER — Other Ambulatory Visit: Payer: Self-pay | Admitting: Family Medicine

## 2020-05-28 DIAGNOSIS — R928 Other abnormal and inconclusive findings on diagnostic imaging of breast: Secondary | ICD-10-CM | POA: Diagnosis not present

## 2020-05-28 DIAGNOSIS — C50411 Malignant neoplasm of upper-outer quadrant of right female breast: Secondary | ICD-10-CM

## 2020-05-28 DIAGNOSIS — N6489 Other specified disorders of breast: Secondary | ICD-10-CM | POA: Diagnosis not present

## 2020-06-04 DIAGNOSIS — L661 Lichen planopilaris: Secondary | ICD-10-CM | POA: Diagnosis not present

## 2020-06-04 DIAGNOSIS — Z79899 Other long term (current) drug therapy: Secondary | ICD-10-CM | POA: Diagnosis not present

## 2020-06-04 MED FILL — HYDROXYCHLOROQUINE 200 MG T: 200 | 30 days supply | Qty: 60 | Fill #0

## 2020-06-15 MED FILL — ALENDRONATE NA 70 MG TAB: 70 | 84 days supply | Qty: 12 | Fill #0

## 2020-06-17 ENCOUNTER — Other Ambulatory Visit: Payer: Self-pay | Admitting: *Deleted

## 2020-06-17 MED ORDER — ALENDRONATE SODIUM 70 MG PO TABS: 70.0000 mg | ORAL_TABLET | ORAL | 0 refills | Status: DC

## 2020-07-04 MED FILL — HYDROXYCHLOROQUINE 200 MG T: 200 | 30 days supply | Qty: 60 | Fill #1

## 2020-07-16 DIAGNOSIS — L661 Lichen planopilaris: Secondary | ICD-10-CM | POA: Diagnosis not present

## 2020-07-16 DIAGNOSIS — Z79899 Other long term (current) drug therapy: Secondary | ICD-10-CM | POA: Diagnosis not present

## 2020-07-29 MED FILL — HYDROXYCHLOROQUINE 200 MG T: 200 | 30 days supply | Qty: 60 | Fill #2

## 2020-08-13 MED FILL — SYNTHROID 88 MCG TABLET: 88 | 90 days supply | Qty: 90 | Fill #3

## 2020-08-13 MED FILL — ANASTROZOLE 1 MG TABLET: 1 | 90 days supply | Qty: 90 | Fill #3

## 2020-08-30 MED FILL — HYDROXYCHLOROQUINE 200 MG T: 200 | 30 days supply | Qty: 60 | Fill #3

## 2020-09-11 ENCOUNTER — Other Ambulatory Visit: Payer: Self-pay | Admitting: Hematology and Oncology

## 2020-09-11 MED FILL — ALENDRONATE NA 70 MG TAB: 70 | 84 days supply | Qty: 12 | Fill #0

## 2020-09-21 DIAGNOSIS — H5203 Hypermetropia, bilateral: Secondary | ICD-10-CM | POA: Diagnosis not present

## 2020-09-21 DIAGNOSIS — Z79899 Other long term (current) drug therapy: Secondary | ICD-10-CM | POA: Diagnosis not present

## 2020-09-22 ENCOUNTER — Other Ambulatory Visit (HOSPITAL_COMMUNITY): Payer: Self-pay | Admitting: Dermatology

## 2020-09-22 DIAGNOSIS — L661 Lichen planopilaris: Secondary | ICD-10-CM | POA: Diagnosis not present

## 2020-09-22 DIAGNOSIS — Z79899 Other long term (current) drug therapy: Secondary | ICD-10-CM | POA: Diagnosis not present

## 2020-09-23 MED FILL — HYDROXYCHLOROQUINE 200 MG T: 200 | 30 days supply | Qty: 60 | Fill #0

## 2020-10-06 MED FILL — HYDROXYCHLOROQUINE 200 MG T: 200 | 30 days supply | Qty: 60 | Fill #0

## 2020-10-20 NOTE — Progress Notes (Signed)
Patient Care Team: Harlan Stains, MD as PCP - General (Family Medicine) Rolm Bookbinder, MD as Consulting Physician (General Surgery) Nicholas Lose, MD as Consulting Physician (Hematology and Oncology) Gery Pray, MD as Consulting Physician (Radiation Oncology) Gardenia Phlegm, NP as Nurse Practitioner (Hematology and Oncology)  DIAGNOSIS:    ICD-10-CM   1. Malignant neoplasm of upper-outer quadrant of right breast in female, estrogen receptor positive ([place])  C50.411    Z17.0     SUMMARY OF ONCOLOGIC HISTORY: Oncology History  Malignant neoplasm of upper-outer quadrant of right breast in female, estrogen receptor positive ([place])  05/03/2017 Initial Diagnosis   Screening detected right breast calcifications 7 mm, right axilla negative, tumor biopsy grade 3 IDC with DCIS plus LCIS ER 100%, PR 2%, HER-2 negative ratio 1.45, Ki-67 25%, T1 BN 0 stage IA clinical stage   05/29/2017 Surgery   Right lumpectomy: IDC grade 2, 1.8 cm, DCIS intermediate grade, margins negative, 0/2 lymph nodes negative, ER 100%, PR 2%, HER-2 negative ratio 1.45, Ki-67 25%, T1c N0 stage IA   06/06/2017 Oncotype testing   Oncotype DX score 20:13% risk of recurrence   07/05/2017 - 08/09/2017 Radiation Therapy   Adj XRT at [place]   08/2017 -  Anti-estrogen oral therapy   Anastrozole daily     CHIEF COMPLIANT: Follow-up of right breast cancer on anastrozole  INTERVAL HISTORY: Diana Jacobson is a 64 y.o. with above-mentioned history of right breast cancer treated with lumpectomy, radiation therapy, and who is currently on antiestrogen therapy with anastrozole. Mammogram on 05/28/20 showed no evidence of malignancy bilaterally. She presents to the clinic today for annual follow-up.   ALLERGIES:  has No Known Allergies.  MEDICATIONS:  Current Outpatient Medications  Medication Sig Dispense Refill  . alendronate (FOSAMAX) 70 MG tablet TAKE 1 TABLET BY MOUTH ONCE A WEEK. TAKE WITH A FULL GLASS OF  WATER ON AN EMPTY STOMACH. 12 tablet 0  . anastrozole (ARIMIDEX) 1 MG tablet Take 1 tablet (1 mg total) by mouth daily. 90 tablet 3  . Bioflavonoid Products (ESTER-C) 500-200-60 MG TABS Take 1 tablet by mouth daily.    . Calcium 500-125 MG-UNIT TABS Take 2 tablets by mouth daily.    . clobetasol (TEMOVATE) 0.05 % external solution   1  . clobetasol ointment (TEMOVATE) 0.05 %   0  . finasteride (PROSCAR) 5 MG tablet Take 0.5 tablets (2.5 mg total) by mouth daily.    . fluocinonide (LIDEX) 0.05 % external solution   1  . glucosamine-chondroitin 500-400 MG tablet Take 1 tablet by mouth daily.    Marland Kitchen levocetirizine (XYZAL) 5 MG tablet Take 1 tablet (5 mg total) by mouth every evening. 30 tablet 12  . levothyroxine (SYNTHROID, LEVOTHROID) 88 MCG tablet Take 88 mcg by mouth daily before breakfast.    . Multiple Vitamin (MULTIVITAMIN) tablet Take 1 tablet by mouth daily.    Marland Kitchen neomycin-polymyxin-hydrocortisone (CORTISPORIN) OTIC solution Apply 1-2 drops to toe after soaking BID 10 mL 1  . NON FORMULARY Take 1 tablet by mouth daily. DIGEST ZEN TABLET     No current facility-administered medications for this visit.    PHYSICAL EXAMINATION: ECOG PERFORMANCE STATUS: 1 - Symptomatic but completely ambulatory  There were no vitals filed for this visit. There were no vitals filed for this visit.  BREAST: No palpable masses or nodules in either right or left breasts. No palpable axillary supraclavicular or infraclavicular adenopathy no breast tenderness or nipple discharge. (exam performed in the presence of a  chaperone)  LABORATORY DATA:  I have reviewed the data as listed CMP Latest Ref Rng & Units 09/12/2017 05/10/2017  Glucose 70 - 140 mg/dl - 94  BUN 7.0 - 26.0 mg/dL - 15.5  Creatinine 0.6 - 1.1 mg/dL - 0.8  Sodium 136 - 145 mEq/L - 140  Potassium 3.5 - 5.1 mEq/L - 4.2  CO2 22 - 29 mEq/L - 28  Calcium 8.4 - 10.4 mg/dL - 9.8  Total Protein 6.5 - 8.1 g/dL 7.2 6.9  Total Bilirubin 0.3 - 1.2 mg/dL  0.8 0.49  Alkaline Phos 38 - 126 U/L 59 75  AST 15 - 41 U/L 21 18  ALT 14 - 54 U/L 15 14    Lab Results  Component Value Date   WBC 4.3 07/31/2017   HGB 13.1 07/31/2017   HCT 38.3 07/31/2017   MCV 89.3 07/31/2017   PLT 206 07/31/2017   NEUTROABS 2.8 05/10/2017    ASSESSMENT & PLAN:  Malignant neoplasm of upper-outer quadrant of right breast in female, estrogen receptor positive ([place]) 05/29/2017 Right lumpectomy: IDC grade 2, 1.8 cm, DCIS intermediate grade, margins negative, 0/2 lymph nodes negative, ER 100%, PR 2%, HER-2 negative ratio 1.45, Ki-67 25%, T1c N0 stage IA  Oncotype DX testing reveals score of 20; risk of recurrence 13%, no role of chemotherapy [place]  Current Treatment:Anastrozole 1 mg daily started September 2018, planned treatment duration 5 to 7 years  Anastrozole toxicities: 1.Hot flashes: Mild 2.myalgias: Mild Patient has been taking CBD oil and she has noticed an improvement in her symptoms  Breast cancer surveillance: 1.Mammogram: 05/28/2020 Benign, breast density category B  [place] left axilla: normal sized LN 2.breast exam 10/21/2020: Benign 3.  Bone density 06/05/2018: T score -2: Osteopenia  Recommendation: Fosamax along with calcium and vitamin D. Patient stays very active by playing tennis at the [employer] courts  Return to clinic in 1 year for follow-up    No orders of the defined types were placed in this encounter.  The patient has a good understanding of the overall plan. she agrees with it. she will call with any problems that may develop before the next visit here.  Total time spent: 20 mins including face to face time and time spent for planning, charting and coordination of care  Nicholas Lose, MD 10/21/2020  I, Cloyde Reams Dorshimer, am acting as scribe for Dr. Nicholas Lose.  I have reviewed the above documentation for accuracy and completeness, and I agree with  the above.

## 2020-10-21 ENCOUNTER — Inpatient Hospital Stay: Payer: 59 | Attending: Hematology and Oncology | Admitting: Hematology and Oncology

## 2020-10-21 ENCOUNTER — Telehealth: Payer: Self-pay | Admitting: Hematology and Oncology

## 2020-10-21 ENCOUNTER — Other Ambulatory Visit: Payer: Self-pay

## 2020-10-21 DIAGNOSIS — M8580 Other specified disorders of bone density and structure, unspecified site: Secondary | ICD-10-CM | POA: Diagnosis not present

## 2020-10-21 DIAGNOSIS — Z170 Estrogen receptor positive status [ER+]: Secondary | ICD-10-CM | POA: Insufficient documentation

## 2020-10-21 DIAGNOSIS — C50411 Malignant neoplasm of upper-outer quadrant of right female breast: Secondary | ICD-10-CM | POA: Insufficient documentation

## 2020-10-21 DIAGNOSIS — N951 Menopausal and female climacteric states: Secondary | ICD-10-CM | POA: Insufficient documentation

## 2020-10-21 DIAGNOSIS — Z79811 Long term (current) use of aromatase inhibitors: Secondary | ICD-10-CM | POA: Insufficient documentation

## 2020-10-21 MED ORDER — LEVOCETIRIZINE DIHYDROCHLORIDE 5 MG PO TABS: 5.0000 mg | ORAL_TABLET | Freq: Every day | ORAL | 12 refills | Status: DC | PRN

## 2020-10-21 MED ORDER — ANASTROZOLE 1 MG PO TABS
1.0000 mg | ORAL_TABLET | Freq: Every day | ORAL | 3 refills | Status: DC
Start: 2020-10-21 — End: 2020-11-10

## 2020-10-21 MED ORDER — HYDROXYCHLOROQUINE SULFATE 200 MG PO TABS: 200.0000 mg | ORAL_TABLET | Freq: Two times a day (BID) | ORAL | Status: DC

## 2020-10-21 NOTE — Telephone Encounter (Signed)
Scheduled appointment per 1/13 los. Spoke to patient who is aware of appointment date and time.  

## 2020-10-21 NOTE — Assessment & Plan Note (Signed)
05/29/2017 Right lumpectomy: IDC grade 2, 1.8 cm, DCIS intermediate grade, margins negative, 0/2 lymph nodes negative, ER 100%, PR 2%, HER-2 negative ratio 1.45, Ki-67 25%, T1c N0 stage IA  Oncotype DX testing reveals score of 20; risk of recurrence 13%, no role of chemotherapy [place] [place]  Current Treatment:Anastrozole 1 mg daily started September 2018  Anastrozole toxicities: 1.Hot flashes: Mild 2.myalgias: Mild Patient has been taking CBD oil and she has noticed an improvement in her symptoms  Breast cancer surveillance: 1.Mammogram: 05/28/2020 Benign, breast density category B  [place] left axilla: normal sized LN 2.breast exam 10/21/2020: Benign 3.  Bone density 06/05/2018: T score -2: Osteopenia  Recommendation: Fosamax along with calcium and vitamin D. Patient stays very active by playing tennis at the [employer] courts  Return to clinic in 1 year for follow-up

## 2020-10-24 MED FILL — ANASTROZOLE 1 MG TABLET: 1 | 90 days supply | Qty: 90 | Fill #0

## 2020-11-10 ENCOUNTER — Other Ambulatory Visit: Payer: Self-pay | Admitting: Hematology and Oncology

## 2020-11-10 ENCOUNTER — Other Ambulatory Visit: Payer: Self-pay | Admitting: *Deleted

## 2020-11-10 MED ORDER — ANASTROZOLE 1 MG PO TABS
1.0000 mg | ORAL_TABLET | Freq: Every day | ORAL | 3 refills | Status: DC
Start: 2020-11-10 — End: 2020-11-10

## 2020-11-10 MED FILL — SYNTHROID 88 MCG TABLET: 88 | 90 days supply | Qty: 90 | Fill #0

## 2020-11-10 MED FILL — ANASTROZOLE 1 MG TABLET: 1 | 90 days supply | Qty: 90 | Fill #0

## 2020-11-14 MED FILL — HYDROXYCHLOROQUINE 200 MG T: 200 | 30 days supply | Qty: 60 | Fill #1

## 2020-11-23 DIAGNOSIS — L661 Lichen planopilaris: Secondary | ICD-10-CM | POA: Diagnosis not present

## 2020-11-23 DIAGNOSIS — Z79899 Other long term (current) drug therapy: Secondary | ICD-10-CM | POA: Diagnosis not present

## 2020-11-30 MED FILL — ALENDRONATE NA 70 MG TAB: 70 | 84 days supply | Qty: 12 | Fill #0

## 2020-12-14 ENCOUNTER — Ambulatory Visit (HOSPITAL_COMMUNITY)
Admission: RE | Admit: 2020-12-14 | Discharge: 2020-12-14 | Disposition: A | Discharge status: Home / Self Care | Payer: 59 | Source: Ambulatory Visit | Attending: Pulmonary Disease | Admitting: Pulmonary Disease

## 2020-12-14 ENCOUNTER — Telehealth: Payer: Self-pay | Admitting: Oncology

## 2020-12-14 ENCOUNTER — Other Ambulatory Visit: Payer: Self-pay | Admitting: Oncology

## 2020-12-14 ENCOUNTER — Encounter: Payer: Self-pay | Admitting: Oncology

## 2020-12-14 DIAGNOSIS — J209 Acute bronchitis, unspecified: Secondary | ICD-10-CM | POA: Diagnosis not present

## 2020-12-14 DIAGNOSIS — U071 COVID-19: Secondary | ICD-10-CM | POA: Insufficient documentation

## 2020-12-14 MED ORDER — METHYLPREDNISOLONE SODIUM SUCC 125 MG IJ SOLR: 125.0000 mg | Freq: Once | INTRAMUSCULAR | Status: DC | PRN

## 2020-12-14 MED ORDER — FAMOTIDINE IN NACL 20-0.9 MG/50ML-% IV SOLN: 20.0000 mg | Freq: Once | INTRAVENOUS | Status: DC | PRN

## 2020-12-14 MED ORDER — EPINEPHRINE 0.3 MG/0.3ML IJ SOAJ: 0.3000 mg | Freq: Once | INTRAMUSCULAR | Status: DC | PRN

## 2020-12-14 MED ORDER — SODIUM CHLORIDE 0.9 % IV SOLN: Freq: Once | INTRAVENOUS | Status: AC

## 2020-12-14 MED ORDER — ALBUTEROL SULFATE HFA 108 (90 BASE) MCG/ACT IN AERS: 2.0000 | INHALATION_SPRAY | Freq: Once | RESPIRATORY_TRACT | Status: DC | PRN

## 2020-12-14 MED ORDER — DIPHENHYDRAMINE HCL 50 MG/ML IJ SOLN: 50.0000 mg | Freq: Once | INTRAMUSCULAR | Status: DC | PRN

## 2020-12-14 MED ORDER — SODIUM CHLORIDE 0.9 % IV SOLN: INTRAVENOUS | Status: DC | PRN

## 2020-12-14 MED FILL — COLCHICINE 0.6 MG TABS: 0.6 | 19 days supply | Qty: 38 | Fill #0

## 2020-12-14 MED FILL — DOXYCYCLINE HYCLATE 100 MG: 100 | 10 days supply | Qty: 20 | Fill #0

## 2020-12-14 MED FILL — predniSONE 5 MG TABS: 5 | 12 days supply | Qty: 48 | Fill #0

## 2020-12-14 NOTE — Discharge Instructions (Signed)
10 Things You Can Do to Manage Your COVID-19 Symptoms at Home If you have possible or confirmed COVID-19: 1. Stay home from work and school. And stay away from other public places. If you must go out, avoid using any kind of public transportation, ridesharing, or taxis. 2. Monitor your symptoms carefully. If your symptoms get worse, call your healthcare provider immediately. 3. Get rest and stay hydrated. 4. If you have a medical appointment, call the healthcare provider ahead of time and tell them that you have or may have COVID-19. 5. For medical emergencies, call 911 and notify the dispatch personnel that you have or may have COVID-19. 6. Cover your cough and sneezes with a tissue or use the inside of your elbow. 7. Wash your hands often with soap and water for at least 20 seconds or clean your hands with an alcohol-based hand sanitizer that contains at least 60% alcohol. 8. As much as possible, stay in a specific room and away from other people in your home. Also, you should use a separate bathroom, if available. If you need to be around other people in or outside of the home, wear a mask. 9. Avoid sharing personal items with other people in your household, like dishes, towels, and bedding. 10. Clean all surfaces that are touched often, like counters, tabletops, and doorknobs. Use household cleaning sprays or wipes according to the label instructions. cdc.gov/coronavirus 06/19/2019 This information is not intended to replace advice given to you by your health care provider. Make sure you discuss any questions you have with your health care provider. Document Revised: 11/21/2019 Document Reviewed: 11/21/2019 Elsevier Patient Education  2020 Elsevier Inc. What types of side effects do monoclonal antibody drugs cause?  Common side effects  In general, the more common side effects caused by monoclonal antibody drugs include: . Allergic reactions, such as hives or itching . Flu-like signs and  symptoms, including chills, fatigue, fever, and muscle aches and pains . Nausea, vomiting . Diarrhea . Skin rashes . Low blood pressure   The CDC is recommending patients who receive monoclonal antibody treatments wait at least 90 days before being vaccinated.  Currently, there are no data on the safety and efficacy of mRNA COVID-19 vaccines in persons who received monoclonal antibodies or convalescent plasma as part of COVID-19 treatment. Based on the estimated half-life of such therapies as well as evidence suggesting that reinfection is uncommon in the 90 days after initial infection, vaccination should be deferred for at least 90 days, as a precautionary measure until additional information becomes available, to avoid interference of the antibody treatment with vaccine-induced immune responses. If you have any questions or concerns after the infusion please call the Advanced Practice Provider on call at [phone]. This number is ONLY intended for your use regarding questions or concerns about the infusion post-treatment side-effects.  Please do not provide this number to others for use. For return to work notes please contact your primary care provider.   If someone you know is interested in receiving treatment please have them call the COVID hotline at [phone].   

## 2020-12-14 NOTE — Telephone Encounter (Signed)
I connected by phone with  Mrs. Shedden to discuss the potential use of an new treatment for mild to moderate COVID-19 viral infection in non-hospitalized patients.   This patient is a age/sex that meets the FDA criteria for Emergency Use Authorization of casirivimab\imdevimab.  Has a (+) direct SARS-CoV-2 viral test result 1. Has mild or moderate COVID-19  2. Is ? 64 years of age and weighs ? 40 kg 3. Is NOT hospitalized due to COVID-19 4. Is NOT requiring oxygen therapy or requiring an increase in baseline oxygen flow rate due to COVID-19 5. Is within 10 days of symptom onset 6. Has at least one of the high risk factor(s) for progression to severe COVID-19 and/or hospitalization as defined in EUA. ? Specific high risk criteria : Past Medical History:  Diagnosis Date  . Breast cancer (HCC) 05/03/2017   right breast cancer  . GERD (gastroesophageal reflux disease)   . Hypothyroidism   . Osteopenia   . Personal history of radiation therapy   ?  ?    Symptom onset  12/11/20   I have spoken and communicated the following to the patient or parent/caregiver:   1. FDA has authorized the emergency use of casirivimab\imdevimab for the treatment of mild to moderate COVID-19 in adults and pediatric patients with positive results of direct SARS-CoV-2 viral testing who are 25 years of age and older weighing at least 40 kg, and who are at high risk for progressing to severe COVID-19 and/or hospitalization.   2. The significant known and potential risks and benefits of casirivimab\imdevimab, and the extent to which such potential risks and benefits are unknown.   3. Information on available alternative treatments and the risks and benefits of those alternatives, including clinical trials.   4. Patients treated with casirivimab\imdevimab should continue to self-isolate and use infection control measures (e.g., wear mask, isolate, social distance, avoid sharing personal items, clean and disinfect  "high touch" surfaces, and frequent handwashing) according to CDC guidelines.    5. The patient or parent/caregiver has the option to accept or refuse casirivimab\imdevimab .   After reviewing this information with the patient, The patient agreed to proceed with receiving casirivimab\imdevimab infusion and will be provided a copy of the Fact sheet prior to receiving the infusion.Mignon Pine, AGNP-C [phone] (Infusion Center Hotline)

## 2020-12-14 NOTE — Progress Notes (Signed)
Patient reviewed Fact Sheet for Patients, Parents, and Caregivers for Emergency Use Authorization (EUA) of casirivimab/imdevimab for the Treatment of Coronavirus.  Patient also reviewed and is agreeable to the estimated cost of treatment.  Patient is agreeable to proceed.  

## 2020-12-14 NOTE — Progress Notes (Signed)
  Diagnosis: COVID-19  Physician:  Dr. Patrick Wright  Procedure: Covid Infusion Clinic Med: casirivimab\imdevimab infusion - Provided patient with casirivimab\imdevimab fact sheet for patients, parents and caregivers prior to infusion.  Complications: No immediate complications noted.  Discharge: Discharged home   [name] 12/14/2020   

## 2021-01-05 ENCOUNTER — Other Ambulatory Visit (HOSPITAL_COMMUNITY): Payer: Self-pay | Admitting: Dermatology

## 2021-01-05 MED FILL — HYDROXYCHLOROQUINE 200 MG T: 200 | 30 days supply | Qty: 60 | Fill #0

## 2021-01-11 ENCOUNTER — Other Ambulatory Visit (HOSPITAL_COMMUNITY)
Admission: RE | Admit: 2021-01-11 | Discharge: 2021-01-11 | Disposition: A | Discharge status: Home / Self Care | Payer: 59 | Source: Ambulatory Visit | Attending: Family Medicine | Admitting: Family Medicine

## 2021-01-11 ENCOUNTER — Other Ambulatory Visit: Payer: Self-pay | Admitting: Family Medicine

## 2021-01-11 DIAGNOSIS — Z124 Encounter for screening for malignant neoplasm of cervix: Secondary | ICD-10-CM | POA: Insufficient documentation

## 2021-01-11 DIAGNOSIS — C50411 Malignant neoplasm of upper-outer quadrant of right female breast: Secondary | ICD-10-CM | POA: Diagnosis not present

## 2021-01-11 DIAGNOSIS — J309 Allergic rhinitis, unspecified: Secondary | ICD-10-CM | POA: Diagnosis not present

## 2021-01-11 DIAGNOSIS — Z0000 Encounter for general adult medical examination without abnormal findings: Secondary | ICD-10-CM | POA: Diagnosis not present

## 2021-01-11 DIAGNOSIS — M8588 Other specified disorders of bone density and structure, other site: Secondary | ICD-10-CM | POA: Diagnosis not present

## 2021-01-11 DIAGNOSIS — Z13220 Encounter for screening for lipoid disorders: Secondary | ICD-10-CM | POA: Diagnosis not present

## 2021-01-11 DIAGNOSIS — E039 Hypothyroidism, unspecified: Secondary | ICD-10-CM | POA: Diagnosis not present

## 2021-01-12 ENCOUNTER — Other Ambulatory Visit (HOSPITAL_COMMUNITY): Payer: Self-pay | Admitting: Family Medicine

## 2021-01-12 MED FILL — SYNTHROID 75 MCG TABLET: 75 | 30 days supply | Qty: 30 | Fill #0

## 2021-01-13 LAB — CYTOLOGY - PAP
Comment: NEGATIVE
Diagnosis: NEGATIVE
High risk HPV: NEGATIVE

## 2021-01-18 DIAGNOSIS — Z1321 Encounter for screening for nutritional disorder: Secondary | ICD-10-CM | POA: Diagnosis not present

## 2021-01-18 DIAGNOSIS — E039 Hypothyroidism, unspecified: Secondary | ICD-10-CM | POA: Diagnosis not present

## 2021-02-11 MED FILL — SYNTHROID 75 MCG TABLET: 75 | 30 days supply | Qty: 30 | Fill #1

## 2021-02-11 MED FILL — ANASTROZOLE 1 MG TABLET: 1 | 90 days supply | Qty: 90 | Fill #1

## 2021-02-16 DIAGNOSIS — D2271 Melanocytic nevi of right lower limb, including hip: Secondary | ICD-10-CM | POA: Diagnosis not present

## 2021-02-16 DIAGNOSIS — L603 Nail dystrophy: Secondary | ICD-10-CM | POA: Diagnosis not present

## 2021-02-16 DIAGNOSIS — L578 Other skin changes due to chronic exposure to nonionizing radiation: Secondary | ICD-10-CM | POA: Diagnosis not present

## 2021-02-16 DIAGNOSIS — L609 Nail disorder, unspecified: Secondary | ICD-10-CM | POA: Diagnosis not present

## 2021-02-16 DIAGNOSIS — D225 Melanocytic nevi of trunk: Secondary | ICD-10-CM | POA: Diagnosis not present

## 2021-02-16 DIAGNOSIS — L814 Other melanin hyperpigmentation: Secondary | ICD-10-CM | POA: Diagnosis not present

## 2021-02-16 DIAGNOSIS — L821 Other seborrheic keratosis: Secondary | ICD-10-CM | POA: Diagnosis not present

## 2021-03-01 ENCOUNTER — Other Ambulatory Visit: Payer: Self-pay | Admitting: Hematology and Oncology

## 2021-03-05 ENCOUNTER — Other Ambulatory Visit (HOSPITAL_COMMUNITY): Payer: Self-pay | Admitting: Dermatology

## 2021-03-05 DIAGNOSIS — Z79899 Other long term (current) drug therapy: Secondary | ICD-10-CM | POA: Diagnosis not present

## 2021-03-05 DIAGNOSIS — L661 Lichen planopilaris: Secondary | ICD-10-CM | POA: Diagnosis not present

## 2021-03-12 ENCOUNTER — Other Ambulatory Visit (HOSPITAL_COMMUNITY): Payer: Self-pay | Admitting: Family Medicine

## 2021-03-12 MED FILL — SYNTHROID 75 MCG TABLET: 75 | 30 days supply | Qty: 30 | Fill #0

## 2021-03-12 MED FILL — HYDROXYCHLOROQUINE 200 MG T: 200 | 30 days supply | Qty: 60 | Fill #1

## 2021-03-15 ENCOUNTER — Other Ambulatory Visit (HOSPITAL_COMMUNITY): Payer: Self-pay

## 2021-03-15 MED FILL — NP THYROID 60 MG TABLET: 60 | 30 days supply | Qty: 30 | Fill #0

## 2021-03-17 DIAGNOSIS — E039 Hypothyroidism, unspecified: Secondary | ICD-10-CM | POA: Diagnosis not present

## 2021-03-17 DIAGNOSIS — D72819 Decreased white blood cell count, unspecified: Secondary | ICD-10-CM | POA: Diagnosis not present

## 2021-04-11 MED FILL — Thyroid Tab 60 MG (1 Grain): ORAL | 30 days supply | Qty: 30 | Fill #0 | Status: AC

## 2021-04-12 ENCOUNTER — Other Ambulatory Visit (HOSPITAL_COMMUNITY): Payer: Self-pay

## 2021-04-22 ENCOUNTER — Other Ambulatory Visit: Payer: Self-pay | Admitting: Family Medicine

## 2021-04-22 DIAGNOSIS — Z853 Personal history of malignant neoplasm of breast: Secondary | ICD-10-CM

## 2021-05-14 ENCOUNTER — Other Ambulatory Visit (HOSPITAL_COMMUNITY): Payer: Self-pay

## 2021-05-14 MED FILL — Thyroid Tab 60 MG (1 Grain): ORAL | 30 days supply | Qty: 30 | Fill #1 | Status: AC

## 2021-05-14 MED FILL — Anastrozole Tab 1 MG: ORAL | 90 days supply | Qty: 90 | Fill #0 | Status: AC

## 2021-05-17 ENCOUNTER — Other Ambulatory Visit: Payer: Self-pay | Admitting: Hematology and Oncology

## 2021-05-18 ENCOUNTER — Other Ambulatory Visit (HOSPITAL_COMMUNITY): Payer: Self-pay

## 2021-05-18 MED ORDER — ALENDRONATE SODIUM 70 MG PO TABS
ORAL_TABLET | ORAL | 0 refills | Status: DC
  Filled 2021-05-18: qty 12, 84d supply, fill #0

## 2021-05-31 ENCOUNTER — Ambulatory Visit
Admission: RE | Admit: 2021-05-31 | Discharge: 2021-05-31 | Disposition: A | Discharge status: Home / Self Care | Payer: 59 | Source: Ambulatory Visit | Attending: Family Medicine | Admitting: Family Medicine

## 2021-05-31 ENCOUNTER — Other Ambulatory Visit: Payer: Self-pay

## 2021-05-31 DIAGNOSIS — Z853 Personal history of malignant neoplasm of breast: Secondary | ICD-10-CM

## 2021-05-31 DIAGNOSIS — R922 Inconclusive mammogram: Secondary | ICD-10-CM | POA: Diagnosis not present

## 2021-06-07 DIAGNOSIS — D72819 Decreased white blood cell count, unspecified: Secondary | ICD-10-CM | POA: Diagnosis not present

## 2021-06-14 MED FILL — Thyroid Tab 60 MG (1 Grain): ORAL | 30 days supply | Qty: 30 | Fill #2 | Status: AC

## 2021-06-15 ENCOUNTER — Other Ambulatory Visit (HOSPITAL_COMMUNITY): Payer: Self-pay

## 2021-07-05 IMAGING — MG DIGITAL DIAGNOSTIC BILAT W/ TOMO W/ CAD
6 of 9 series · 6 of 25 positions shown · non-contrast
Comparison: Previous exam(s).

CLINICAL DATA: 64-year-old female presenting for routine annual
has not had the COVID vaccination.

EXAM:
DIGITAL DIAGNOSTIC BILATERAL MAMMOGRAM WITH CAD AND TOMO
LEFT AXILLARY ULTRASOUND

[R CC]
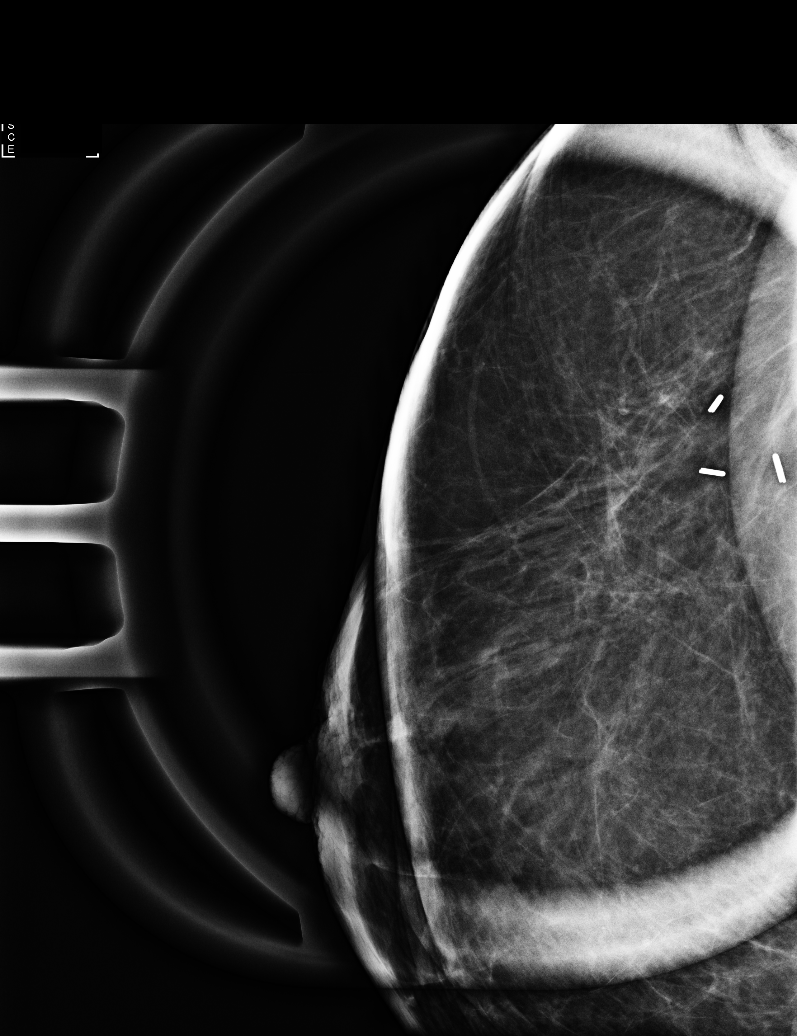

[L MLO synth-2D]
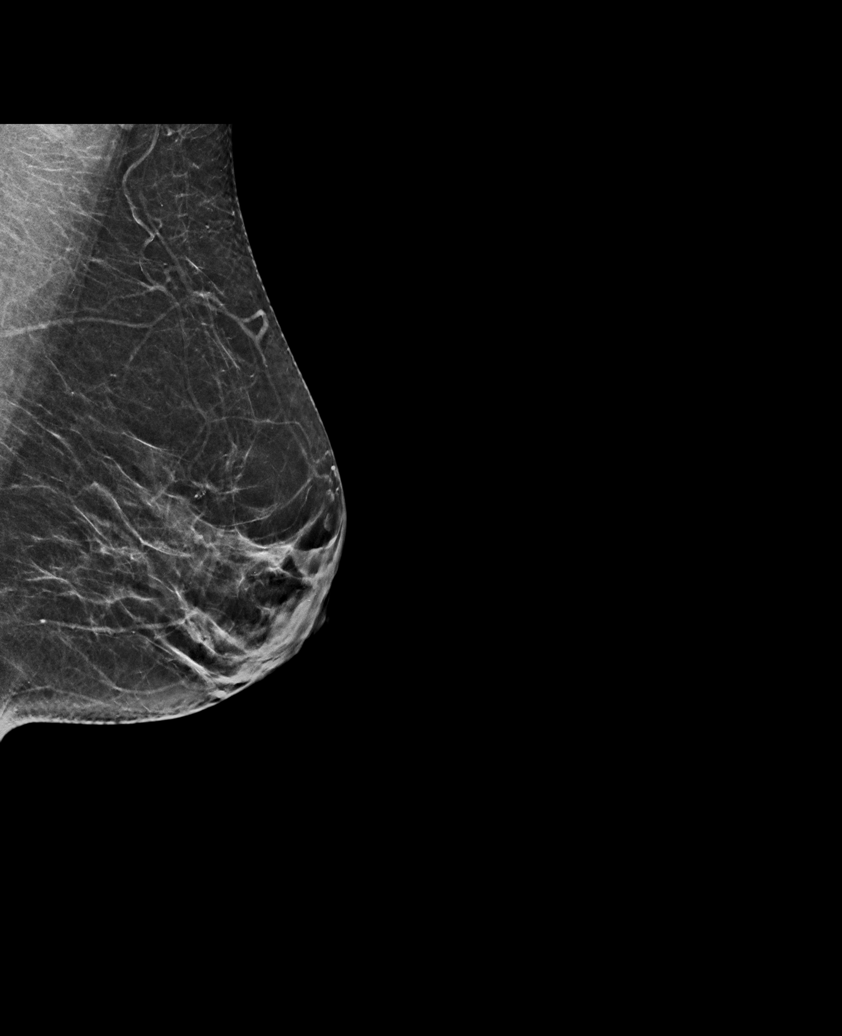

[R CC synth-2D]
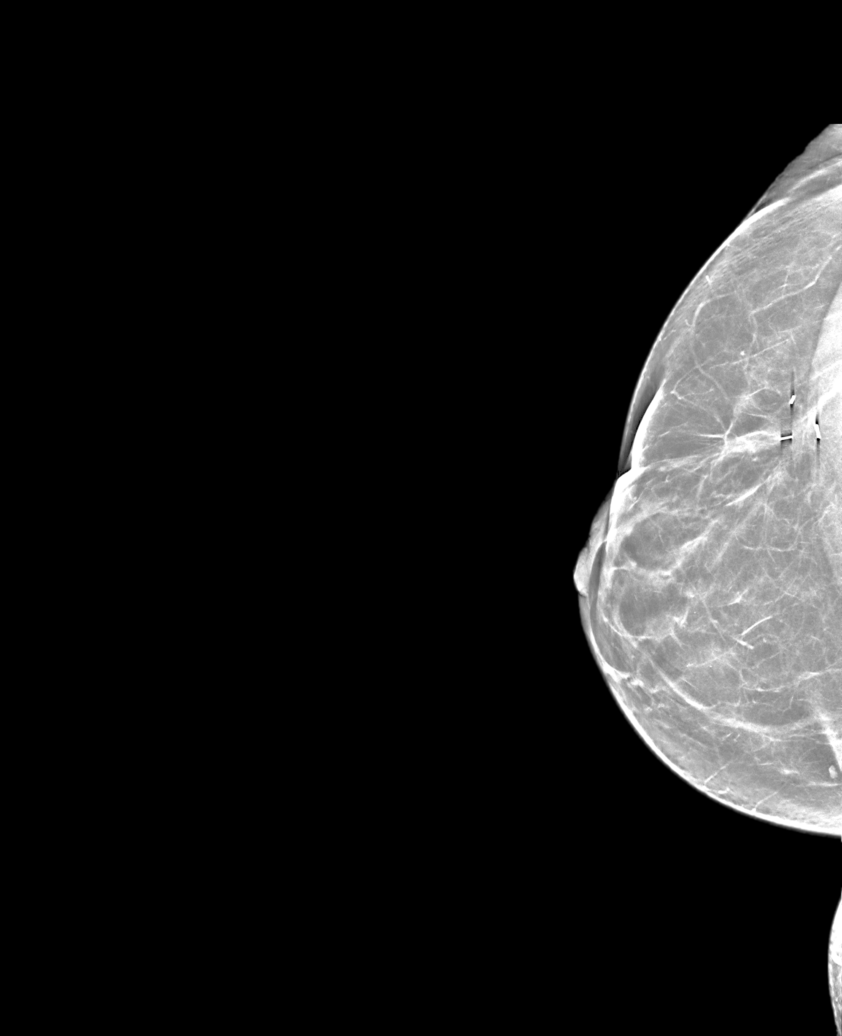

[L CC synth-2D]
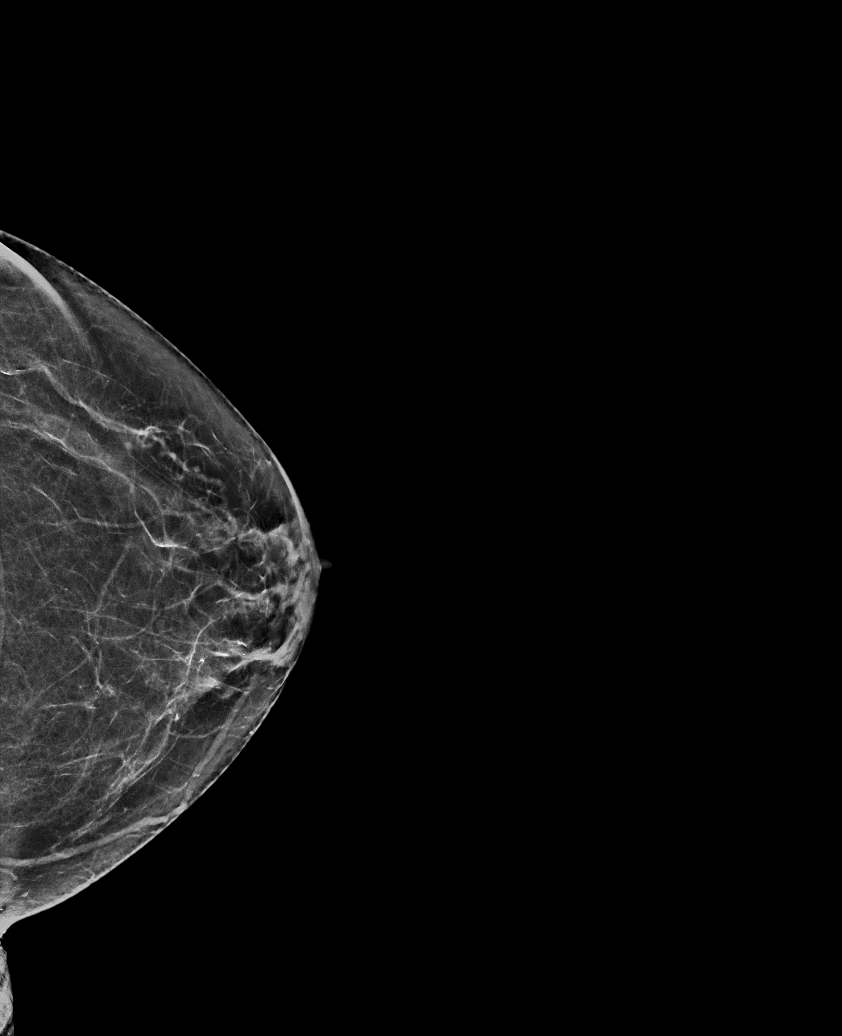

[R MLO synth-2D]
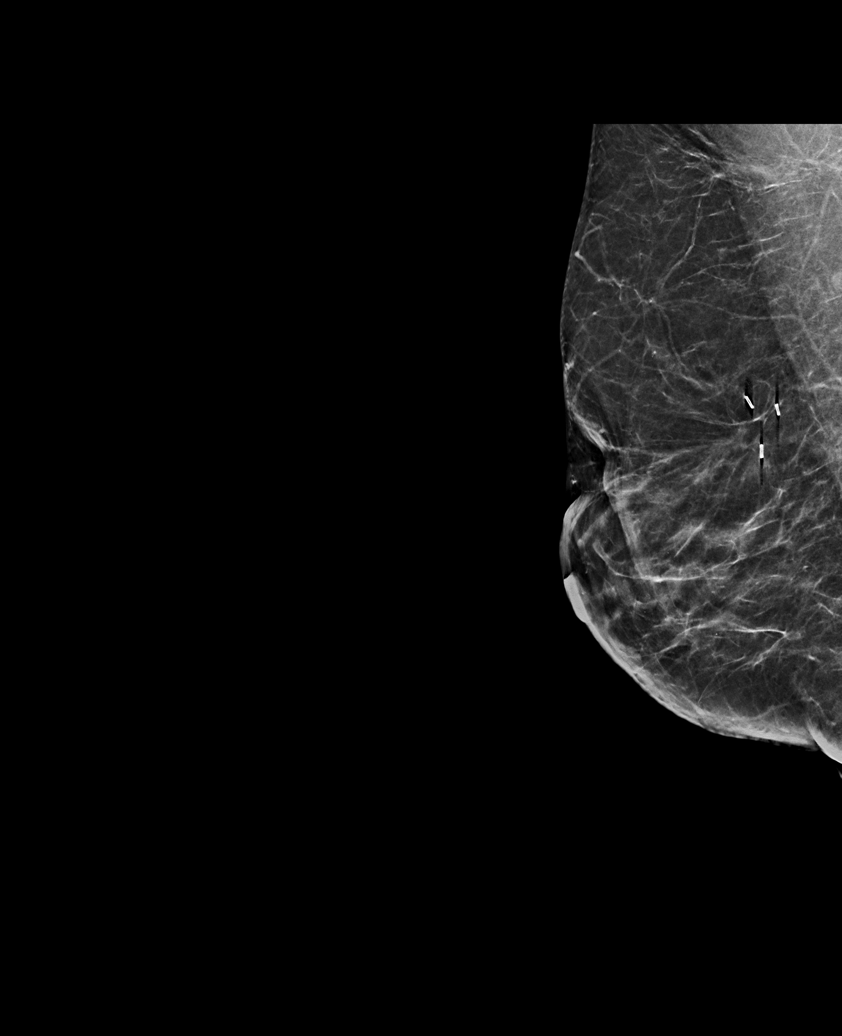

[L CC tomo · tomo slice 29/57.0]
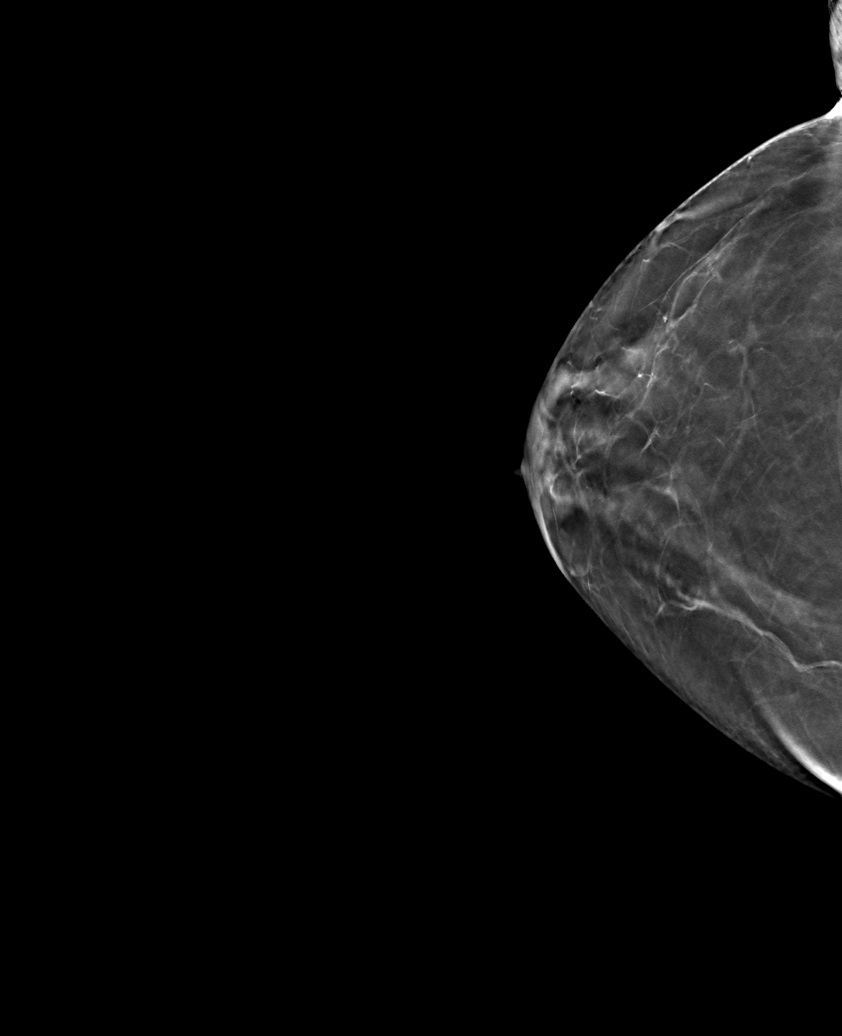

[6 of 25 positions shown; findings below may reference images not displayed]

ACR Breast Density Category b: There are scattered areas of
fibroglandular density.
FINDINGS: The right breast lumpectomy site is stable. A left axillary lymph
node appears possibly mildly thickened as compared to the patient's
prior mammograms. No suspicious calcifications, masses or areas of
distortion are seen in the bilateral breasts.

Mammographic images were processed with CAD.

Ultrasound of the left axilla demonstrates multiple normal lymph
nodes. No abnormally thickened lymph nodes are identified.
IMPRESSION: Stable left breast lumpectomy site. No mammographic evidence of
malignancy in the bilateral breasts.

RECOMMENDATION:
Diagnostic mammogram is suggested in 1 year. (Code:[id number])

I have discussed the findings and recommendations with the patient.
If applicable, a reminder letter will be sent to the patient
regarding the next appointment.

BI-RADS CATEGORY  2: Benign.

## 2021-08-15 ENCOUNTER — Other Ambulatory Visit: Payer: Self-pay | Admitting: Hematology and Oncology

## 2021-08-16 ENCOUNTER — Other Ambulatory Visit (HOSPITAL_COMMUNITY): Payer: Self-pay

## 2021-08-16 MED ORDER — ALENDRONATE SODIUM 70 MG PO TABS
ORAL_TABLET | ORAL | 0 refills | Status: DC
  Filled 2021-08-16: qty 12, 84d supply, fill #0

## 2021-08-16 MED FILL — Anastrozole Tab 1 MG: ORAL | 90 days supply | Qty: 90 | Fill #1 | Status: AC

## 2021-08-17 ENCOUNTER — Other Ambulatory Visit (HOSPITAL_COMMUNITY): Payer: Self-pay

## 2021-09-22 DIAGNOSIS — H5203 Hypermetropia, bilateral: Secondary | ICD-10-CM | POA: Diagnosis not present

## 2021-09-22 DIAGNOSIS — Z79899 Other long term (current) drug therapy: Secondary | ICD-10-CM | POA: Diagnosis not present

## 2021-09-22 DIAGNOSIS — H2513 Age-related nuclear cataract, bilateral: Secondary | ICD-10-CM | POA: Diagnosis not present

## 2021-10-18 NOTE — Assessment & Plan Note (Signed)
05/29/2017 Right lumpectomy: IDC grade 2, 1.8 cm, DCIS intermediate grade, margins negative, 0/2 lymph nodes negative, ER 100%, PR 2%, HER-2 negative ratio 1.45, Ki-67 25%, T1c N0 stage IA  Oncotype DX testing reveals score of 20; risk of recurrence 13%, no role of chemotherapy [place]  Current Treatment:Anastrozole 1 mg daily started September 2018, planned treatment duration 5 to 7 years  Anastrozole toxicities: 1.Hot flashes: Mild 2.myalgias: Mild Patient has been taking CBD oil and she has noticed an improvement in her symptoms  Breast cancer surveillance: 1.Mammogram:05/28/2020 Benign, breast density category B  [place] left axilla: normal sized LN 2.breast exam11/12/2020: Benign 3.Bone density 05/31/2021: T score -2: Osteopenia  Recommendation:Fosamaxalong with calcium and vitamin D. Patient stays very active by playing tennis at the [employer] courts  Return to clinic in 1 year for follow-up

## 2021-10-18 NOTE — Progress Notes (Signed)
Patient Care Team: Harlan Stains, MD as PCP - General (Family Medicine) Rolm Bookbinder, MD as Consulting Physician (General Surgery) Nicholas Lose, MD as Consulting Physician (Hematology and Oncology) Gery Pray, MD as Consulting Physician (Radiation Oncology) Gardenia Phlegm, NP as Nurse Practitioner (Hematology and Oncology)  DIAGNOSIS:    ICD-10-CM   1. Malignant neoplasm of upper-outer quadrant of right breast in female, estrogen receptor positive ([place])  C50.411    Z17.0       SUMMARY OF ONCOLOGIC HISTORY: Oncology History  Malignant neoplasm of upper-outer quadrant of right breast in female, estrogen receptor positive ([place])  05/03/2017 Initial Diagnosis   Screening detected right breast calcifications 7 mm, right axilla negative, tumor biopsy grade 3 IDC with DCIS plus LCIS ER 100%, PR 2%, HER-2 negative ratio 1.45, Ki-67 25%, T1 BN 0 stage IA clinical stage   05/29/2017 Surgery   Right lumpectomy: IDC grade 2, 1.8 cm, DCIS intermediate grade, margins negative, 0/2 lymph nodes negative, ER 100%, PR 2%, HER-2 negative ratio 1.45, Ki-67 25%, T1c N0 stage IA   06/06/2017 Oncotype testing   Oncotype DX score 20:13% risk of recurrence   07/05/2017 - 08/09/2017 Radiation Therapy   Adj XRT at [place]   08/2017 -  Anti-estrogen oral therapy   Anastrozole daily     CHIEF COMPLIANT: Follow-up of right breast cancer on anastrozole  INTERVAL HISTORY: Diana Jacobson is a 65 y.o. with above-mentioned history of right breast cancer treated with lumpectomy, radiation therapy, and who is currently on antiestrogen therapy with anastrozole. Mammogram on 05/31/21 showed no evidence of malignancy bilaterally. She presents to the clinic today for annual follow-up.  She is tolerating anastrozole extremely well without any problems or concerns.  She does have occasional hot flashes.  She takes CBD oil which has helped her hot flashes significantly.  She has retired last year and has  been spending most of the time staying active with her family and traveling and playing tennis and pickleball.  ALLERGIES:  has No Known Allergies.  MEDICATIONS:  Current Outpatient Medications  Medication Sig Dispense Refill   alendronate (FOSAMAX) 70 MG tablet TAKE 1 TABLET BY MOUTH ONCE A WEEK. TAKE WITH A FULL GLASS OF WATER ON AN EMPTY STOMACH. 12 tablet 0   anastrozole (ARIMIDEX) 1 MG tablet TAKE 1 TABLET BY MOUTH DAILY. 90 tablet 3   Bioflavonoid Products (ESTER-C) 500-200-60 MG TABS Take 1 tablet by mouth daily.     Calcium 500-125 MG-UNIT TABS Take 2 tablets by mouth daily.     clobetasol (TEMOVATE) 0.05 % external solution   1   colchicine 0.6 MG tablet TAKE 1 TABLET BY MOUTH 2 TIMES DAILY 38 tablet 0   doxycycline (VIBRAMYCIN) 100 MG capsule TAKE 1 CAPSULE BY MOUTH 2 TIMES DAILY 20 capsule 0   glucosamine-chondroitin 500-400 MG tablet Take 1 tablet by mouth daily.     levocetirizine (XYZAL) 5 MG tablet Take 1 tablet (5 mg total) by mouth daily as needed for allergies. 30 tablet 12   Multiple Vitamin (MULTIVITAMIN) tablet Take 1 tablet by mouth daily.     neomycin-polymyxin-hydrocortisone (CORTISPORIN) OTIC solution Apply 1-2 drops to toe after soaking BID 10 mL 1   NON FORMULARY Take 1 tablet by mouth daily. DIGEST ZEN TABLET     tacrolimus (PROTOPIC) 0.1 % ointment APPLY TO THE AFFECTED AREA(S) ONCE A DAY 60 g 2   thyroid (ARMOUR) 60 MG tablet Take 1 tablet (60 mg total) by mouth daily before breakfast. 30  tablet 3   No current facility-administered medications for this visit.    PHYSICAL EXAMINATION: ECOG PERFORMANCE STATUS: 1 - Symptomatic but completely ambulatory  Vitals:   10/19/21 0834  BP: 139/64  Pulse: 69  Resp: 18  Temp: (!) 97.3 F (36.3 C)  SpO2: 100%   Filed Weights   10/19/21 0834  Weight: 158 lb 3.2 oz (71.8 kg)    BREAST: No palpable masses or nodules in either right or left breasts. No palpable axillary supraclavicular or infraclavicular  adenopathy no breast tenderness or nipple discharge. (exam performed in the presence of a chaperone)  LABORATORY DATA:  I have reviewed the data as listed CMP Latest Ref Rng & Units 09/12/2017 05/10/2017  Glucose 70 - 140 mg/dl - 94  BUN 7.0 - 26.0 mg/dL - 15.5  Creatinine 0.6 - 1.1 mg/dL - 0.8  Sodium 136 - 145 mEq/L - 140  Potassium 3.5 - 5.1 mEq/L - 4.2  CO2 22 - 29 mEq/L - 28  Calcium 8.4 - 10.4 mg/dL - 9.8  Total Protein 6.5 - 8.1 g/dL 7.2 6.9  Total Bilirubin 0.3 - 1.2 mg/dL 0.8 0.49  Alkaline Phos 38 - 126 U/L 59 75  AST 15 - 41 U/L 21 18  ALT 14 - 54 U/L 15 14    Lab Results  Component Value Date   WBC 4.3 07/31/2017   HGB 13.1 07/31/2017   HCT 38.3 07/31/2017   MCV 89.3 07/31/2017   PLT 206 07/31/2017   NEUTROABS 2.8 05/10/2017    ASSESSMENT & PLAN:  Malignant neoplasm of upper-outer quadrant of right breast in female, estrogen receptor positive ([place]) 05/29/2017 Right lumpectomy: IDC grade 2, 1.8 cm, DCIS intermediate grade, margins negative, 0/2 lymph nodes negative, ER 100%, PR 2%, HER-2 negative ratio 1.45, Ki-67 25%, T1c N0 stage IA   Oncotype DX testing reveals score of 20; risk of recurrence 13%, no role of chemotherapy Adjuvant radiation therapy at [place] Completed 08/09/2017   Current Treatment: Anastrozole 1 mg daily started September 2018, planned treatment duration 5 to 7 years   Anastrozole toxicities: 1.  Hot flashes: Mild 2. myalgias: Mild Patient has been taking CBD oil and she has noticed an improvement in her symptoms   Breast cancer surveillance: 1.  Mammogram: 05/28/2020 Benign, breast density category B  [place] left axilla: normal sized LN 2. breast exam 10/19/2021: Benign 3.  Bone density 05/31/2021: T score -2: Osteopenia: Currently on Fosamax along with calcium and vitamin D    Patient stays very active by playing tennis and pickleball at the [employer] courts She has a place upon [facility] and goes there very often which is a  suburb of [place]. Return to clinic in 1 year for follow-up    No orders of the defined types were placed in this encounter.  The patient has a good understanding of the overall plan. she agrees with it. she will call with any problems that may develop before the next visit here.  Total time spent: 20 mins including face to face time and time spent for planning, charting and coordination of care  Rulon Eisenmenger, MD, MPH 10/19/2021  I, Thana Ates, am acting as scribe for Dr. Nicholas Lose.  I have reviewed the above documentation for accuracy and completeness, and I agree with the above.

## 2021-10-19 ENCOUNTER — Inpatient Hospital Stay: Payer: PPO | Attending: Hematology and Oncology | Admitting: Hematology and Oncology

## 2021-10-19 ENCOUNTER — Other Ambulatory Visit: Payer: Self-pay

## 2021-10-19 ENCOUNTER — Other Ambulatory Visit (HOSPITAL_COMMUNITY): Payer: Self-pay

## 2021-10-19 DIAGNOSIS — Z923 Personal history of irradiation: Secondary | ICD-10-CM | POA: Diagnosis not present

## 2021-10-19 DIAGNOSIS — Z170 Estrogen receptor positive status [ER+]: Secondary | ICD-10-CM | POA: Diagnosis not present

## 2021-10-19 DIAGNOSIS — C50411 Malignant neoplasm of upper-outer quadrant of right female breast: Secondary | ICD-10-CM | POA: Diagnosis not present

## 2021-10-19 DIAGNOSIS — Z79811 Long term (current) use of aromatase inhibitors: Secondary | ICD-10-CM | POA: Insufficient documentation

## 2021-10-19 MED ORDER — ANASTROZOLE 1 MG PO TABS
ORAL_TABLET | Freq: Every day | ORAL | 3 refills | Status: DC
Start: 2021-10-19 — End: 2022-10-28
  Filled 2021-10-19 – 2021-11-15 (×2): qty 90, 90d supply, fill #0
  Filled 2022-02-10: qty 90, 90d supply, fill #1
  Filled 2022-05-16: qty 90, 90d supply, fill #2
  Filled 2022-08-15: qty 90, 90d supply, fill #3

## 2021-10-19 MED ORDER — THYROID 60 MG PO TABS: 60.0000 mg | ORAL_TABLET | Freq: Every day | ORAL | 3 refills | Status: DC

## 2021-10-19 MED ORDER — ALENDRONATE SODIUM 70 MG PO TABS
ORAL_TABLET | ORAL | 3 refills | Status: AC
  Filled 2021-10-19 – 2022-01-10 (×2): qty 12, 84d supply, fill #0
  Filled 2022-04-11: qty 12, 84d supply, fill #1
  Filled 2022-07-22: qty 12, 84d supply, fill #2

## 2021-10-27 ENCOUNTER — Other Ambulatory Visit (HOSPITAL_COMMUNITY): Payer: Self-pay

## 2021-11-15 ENCOUNTER — Other Ambulatory Visit (HOSPITAL_COMMUNITY): Payer: Self-pay

## 2022-01-10 ENCOUNTER — Other Ambulatory Visit (HOSPITAL_COMMUNITY): Payer: Self-pay

## 2022-01-27 ENCOUNTER — Other Ambulatory Visit (HOSPITAL_COMMUNITY): Payer: Self-pay

## 2022-01-27 DIAGNOSIS — Z0000 Encounter for general adult medical examination without abnormal findings: Secondary | ICD-10-CM | POA: Diagnosis not present

## 2022-01-27 DIAGNOSIS — E039 Hypothyroidism, unspecified: Secondary | ICD-10-CM | POA: Diagnosis not present

## 2022-01-27 DIAGNOSIS — Z13220 Encounter for screening for lipoid disorders: Secondary | ICD-10-CM | POA: Diagnosis not present

## 2022-01-27 MED ORDER — NP THYROID 60 MG PO TABS
ORAL_TABLET | ORAL | 3 refills | Status: DC
Start: 2022-01-27 — End: 2022-04-26
  Filled 2022-01-27: qty 90, 90d supply, fill #0

## 2022-01-28 ENCOUNTER — Other Ambulatory Visit (HOSPITAL_COMMUNITY): Payer: Self-pay

## 2022-01-28 MED ORDER — NP THYROID 90 MG PO TABS
ORAL_TABLET | ORAL | 0 refills | Status: DC
  Filled 2022-01-28 (×2): qty 90, 90d supply, fill #0

## 2022-01-31 ENCOUNTER — Other Ambulatory Visit (HOSPITAL_COMMUNITY): Payer: Self-pay

## 2022-02-10 ENCOUNTER — Other Ambulatory Visit (HOSPITAL_COMMUNITY): Payer: Self-pay

## 2022-02-17 DIAGNOSIS — L821 Other seborrheic keratosis: Secondary | ICD-10-CM | POA: Diagnosis not present

## 2022-02-17 DIAGNOSIS — L578 Other skin changes due to chronic exposure to nonionizing radiation: Secondary | ICD-10-CM | POA: Diagnosis not present

## 2022-02-17 DIAGNOSIS — L814 Other melanin hyperpigmentation: Secondary | ICD-10-CM | POA: Diagnosis not present

## 2022-02-17 DIAGNOSIS — D2271 Melanocytic nevi of right lower limb, including hip: Secondary | ICD-10-CM | POA: Diagnosis not present

## 2022-02-17 DIAGNOSIS — Z23 Encounter for immunization: Secondary | ICD-10-CM | POA: Diagnosis not present

## 2022-02-17 DIAGNOSIS — D225 Melanocytic nevi of trunk: Secondary | ICD-10-CM | POA: Diagnosis not present

## 2022-04-11 ENCOUNTER — Other Ambulatory Visit (HOSPITAL_COMMUNITY): Payer: Self-pay

## 2022-04-26 ENCOUNTER — Other Ambulatory Visit: Payer: Self-pay | Admitting: Physician Assistant

## 2022-04-26 ENCOUNTER — Other Ambulatory Visit (HOSPITAL_COMMUNITY): Payer: Self-pay

## 2022-04-26 DIAGNOSIS — Z1231 Encounter for screening mammogram for malignant neoplasm of breast: Secondary | ICD-10-CM

## 2022-04-26 MED ORDER — NP THYROID 90 MG PO TABS
ORAL_TABLET | ORAL | 1 refills | Status: DC
  Filled 2022-04-26: qty 90, 90d supply, fill #0

## 2022-04-27 ENCOUNTER — Other Ambulatory Visit (HOSPITAL_COMMUNITY): Payer: Self-pay

## 2022-05-17 ENCOUNTER — Other Ambulatory Visit (HOSPITAL_COMMUNITY): Payer: Self-pay

## 2022-06-01 ENCOUNTER — Ambulatory Visit
Admission: RE | Admit: 2022-06-01 | Discharge: 2022-06-01 | Disposition: A | Discharge status: Home / Self Care | Payer: PPO | Source: Ambulatory Visit | Attending: Physician Assistant | Admitting: Physician Assistant

## 2022-06-01 DIAGNOSIS — Z1231 Encounter for screening mammogram for malignant neoplasm of breast: Secondary | ICD-10-CM

## 2022-07-22 ENCOUNTER — Other Ambulatory Visit (HOSPITAL_COMMUNITY): Payer: Self-pay

## 2022-07-25 DIAGNOSIS — E063 Autoimmune thyroiditis: Secondary | ICD-10-CM | POA: Diagnosis not present

## 2022-07-26 ENCOUNTER — Other Ambulatory Visit (HOSPITAL_COMMUNITY): Payer: Self-pay

## 2022-07-26 MED ORDER — THYROID 15 MG PO TABS
ORAL_TABLET | ORAL | 0 refills | Status: DC
  Filled 2022-07-26: qty 38, 38d supply, fill #0

## 2022-08-01 DIAGNOSIS — E039 Hypothyroidism, unspecified: Secondary | ICD-10-CM | POA: Diagnosis not present

## 2022-08-01 DIAGNOSIS — Z1211 Encounter for screening for malignant neoplasm of colon: Secondary | ICD-10-CM | POA: Diagnosis not present

## 2022-08-01 DIAGNOSIS — R946 Abnormal results of thyroid function studies: Secondary | ICD-10-CM | POA: Diagnosis not present

## 2022-08-11 DIAGNOSIS — Z1211 Encounter for screening for malignant neoplasm of colon: Secondary | ICD-10-CM | POA: Diagnosis not present

## 2022-08-11 DIAGNOSIS — Z1212 Encounter for screening for malignant neoplasm of rectum: Secondary | ICD-10-CM | POA: Diagnosis not present

## 2022-08-16 ENCOUNTER — Other Ambulatory Visit (HOSPITAL_COMMUNITY): Payer: Self-pay

## 2022-08-22 LAB — EXTERNAL GENERIC LAB PROCEDURE: COLOGUARD: NEGATIVE

## 2022-08-22 LAB — COLOGUARD: COLOGUARD: NEGATIVE

## 2022-09-07 DIAGNOSIS — E039 Hypothyroidism, unspecified: Secondary | ICD-10-CM | POA: Diagnosis not present

## 2022-09-23 DIAGNOSIS — H25813 Combined forms of age-related cataract, bilateral: Secondary | ICD-10-CM | POA: Diagnosis not present

## 2022-09-23 DIAGNOSIS — H5203 Hypermetropia, bilateral: Secondary | ICD-10-CM | POA: Diagnosis not present

## 2022-09-30 ENCOUNTER — Telehealth: Payer: Self-pay | Admitting: Hematology and Oncology

## 2022-09-30 NOTE — Telephone Encounter (Signed)
Scheduled follow-up appointment per 11/1 los. Patient is aware. 

## 2022-10-27 NOTE — Progress Notes (Signed)
Patient Care Team: Ramond Dial, PA as PCP - General (Pediatrics) Rolm Bookbinder, MD as Consulting Physician (General Surgery) Nicholas Lose, MD as Consulting Physician (Hematology and Oncology) Gery Pray, MD as Consulting Physician (Radiation Oncology) Delice Bison Charlestine Massed, NP as Nurse Practitioner (Hematology and Oncology)  DIAGNOSIS:  Encounter Diagnoses  Name Primary?   Malignant neoplasm of upper-outer quadrant of right breast in female, estrogen receptor positive ([name]) Yes   Postmenopausal     SUMMARY OF ONCOLOGIC HISTORY: Oncology History  Malignant neoplasm of upper-outer quadrant of right breast in female, estrogen receptor positive ([place])  05/03/2017 Initial Diagnosis   Screening detected right breast calcifications 7 mm, right axilla negative, tumor biopsy grade 3 IDC with DCIS plus LCIS ER 100%, PR 2%, HER-2 negative ratio 1.45, Ki-67 25%, T1 BN 0 stage IA clinical stage   05/29/2017 Surgery   Right lumpectomy: IDC grade 2, 1.8 cm, DCIS intermediate grade, margins negative, 0/2 lymph nodes negative, ER 100%, PR 2%, HER-2 negative ratio 1.45, Ki-67 25%, T1c N0 stage IA   06/06/2017 Oncotype testing   Oncotype DX score 20:13% risk of recurrence   07/05/2017 - 08/09/2017 Radiation Therapy   Adj XRT at [place]   08/2017 -  Anti-estrogen oral therapy   Anastrozole daily     CHIEF COMPLIANT: Follow-up of right breast cancer on anastrozole and Fosamax     INTERVAL HISTORY: Diana Jacobson is a 66 y.o. with above-mentioned history of right breast cancer treated with lumpectomy, radiation therapy, and who is currently on antiestrogen therapy with anastrozole. She presents to the clinic for a follow-up. She reports that the anastrozole makes her stomach hurts. She wants to stop taking it.    ALLERGIES:  has No Known Allergies.  MEDICATIONS:  Current Outpatient Medications  Medication Sig Dispense Refill   Naltrexone 200-6.5 MG IMPL 3 mg.     Alendronate  Sodium (FOSAMAX PO) Fosamax     Bioflavonoid Products (ESTER-C) 500-200-60 MG TABS Take 1 tablet by mouth daily.     Calcium 500-125 MG-UNIT TABS Take 2 tablets by mouth daily.     clobetasol (TEMOVATE) 0.05 % external solution   1   glucosamine-chondroitin 500-400 MG tablet Take 1 tablet by mouth daily.     Multiple Vitamin (MULTIVITAMIN) tablet Take 1 tablet by mouth daily.     NON FORMULARY Take 1 tablet by mouth daily. DIGEST ZEN TABLET     No current facility-administered medications for this visit.    PHYSICAL EXAMINATION: ECOG PERFORMANCE STATUS: 1 - Symptomatic but completely ambulatory  Vitals:   10/28/22 0844  BP: 125/70  Pulse: 74  Resp: 16  Temp: 98 F (36.7 C)  SpO2: 100%   Filed Weights   10/28/22 0844  Weight: 162 lb 1.6 oz (73.5 kg)     LABORATORY DATA:  I have reviewed the data as listed    Latest Ref Rng & Units 09/12/2017   11:02 AM 05/10/2017   12:30 PM  CMP  Glucose 70 - 140 mg/dl  94   BUN 7.0 - 26.0 mg/dL  15.5   Creatinine 0.6 - 1.1 mg/dL  0.8   Sodium 136 - 145 mEq/L  140   Potassium 3.5 - 5.1 mEq/L  4.2   CO2 22 - 29 mEq/L  28   Calcium 8.4 - 10.4 mg/dL  9.8   Total Protein 6.5 - 8.1 g/dL 7.2  6.9   Total Bilirubin 0.3 - 1.2 mg/dL 0.8  0.49   Alkaline Phos 38 -  126 U/L 59  75   AST 15 - 41 U/L 21  18   ALT 14 - 54 U/L 15  14     Lab Results  Component Value Date   WBC 4.3 07/31/2017   HGB 13.1 07/31/2017   HCT 38.3 07/31/2017   MCV 89.3 07/31/2017   PLT 206 07/31/2017   NEUTROABS 2.8 05/10/2017    ASSESSMENT & PLAN:  Malignant neoplasm of upper-outer quadrant of right breast in female, estrogen receptor positive ([place]) 05/29/2017 Right lumpectomy: IDC grade 2, 1.8 cm, DCIS intermediate grade, margins negative, 0/2 lymph nodes negative, ER 100%, PR 2%, HER-2 negative ratio 1.45, Ki-67 25%, T1c N0 stage IA   Oncotype DX testing reveals score of 20; risk of recurrence 13%, no role of chemotherapy Adjuvant radiation therapy at  [place] Completed 08/09/2017   Current Treatment: Anastrozole 1 mg daily started September 2018, discontinuing 10/28/2022    Anastrozole toxicities: 1.  Hot flashes: Mild 2. myalgias: Mild Patient has been taking CBD oil and she has noticed an improvement in her symptoms   Breast cancer surveillance: 1.  Mammogram: 06/02/2022 benign, breast density category C  2. breast exam 10/28/2022: Benign 3.  Bone density 05/31/2021: T score -2: Osteopenia: Currently on Fosamax along with calcium and vitamin D We ordered a bone density test.  Depending on that she will decide whether she can stop Fosamax.  If the bone density is T score less than -2 then she can stop it.    Patient stays very active by playing tennis and pickleball at the [employer] courts She has a place upon [facility] and goes there very often which is a suburb of [place].  Return to clinic on an as-needed basis.    Orders Placed This Encounter  Procedures   DG Bone Density    Standing Status:   Future    Standing Expiration Date:   10/28/2023    Order Specific Question:   Reason for Exam (SYMPTOM  OR DIAGNOSIS REQUIRED)    Answer:   postmenopausal    Order Specific Question:   Preferred imaging location?    Answer:   [facility]   The patient has a good understanding of the overall plan. she agrees with it. she will call with any problems that may develop before the next visit here. Total time spent: 30 mins including face to face time and time spent for planning, charting and co-ordination of care   Harriette Ohara, MD 10/28/22   I Gardiner Coins am scribing for Dr. Lindi Adie  I have reviewed the above documentation for accuracy and completeness, and I agree with the above.

## 2022-10-28 ENCOUNTER — Inpatient Hospital Stay: Payer: PPO | Attending: Hematology and Oncology | Admitting: Hematology and Oncology

## 2022-10-28 ENCOUNTER — Other Ambulatory Visit: Payer: Self-pay

## 2022-10-28 VITALS — BP 125/70 | HR 74 | Temp 98.0°F | Resp 16 | Ht 65.0 in | Wt 162.1 lb

## 2022-10-28 DIAGNOSIS — Z853 Personal history of malignant neoplasm of breast: Secondary | ICD-10-CM | POA: Diagnosis not present

## 2022-10-28 DIAGNOSIS — M8580 Other specified disorders of bone density and structure, unspecified site: Secondary | ICD-10-CM | POA: Insufficient documentation

## 2022-10-28 DIAGNOSIS — Z9223 Personal history of estrogen therapy: Secondary | ICD-10-CM | POA: Diagnosis not present

## 2022-10-28 DIAGNOSIS — Z79899 Other long term (current) drug therapy: Secondary | ICD-10-CM | POA: Insufficient documentation

## 2022-10-28 DIAGNOSIS — Z923 Personal history of irradiation: Secondary | ICD-10-CM | POA: Diagnosis not present

## 2022-10-28 DIAGNOSIS — Z170 Estrogen receptor positive status [ER+]: Secondary | ICD-10-CM | POA: Diagnosis not present

## 2022-10-28 DIAGNOSIS — Z780 Asymptomatic menopausal state: Secondary | ICD-10-CM

## 2022-10-28 DIAGNOSIS — C50411 Malignant neoplasm of upper-outer quadrant of right female breast: Secondary | ICD-10-CM

## 2022-10-28 NOTE — Assessment & Plan Note (Addendum)
05/29/2017 Right lumpectomy: IDC grade 2, 1.8 cm, DCIS intermediate grade, margins negative, 0/2 lymph nodes negative, ER 100%, PR 2%, HER-2 negative ratio 1.45, Ki-67 25%, T1c N0 stage IA   Oncotype DX testing reveals score of 20; risk of recurrence 13%, no role of chemotherapy Adjuvant radiation therapy at [place] Completed 08/09/2017   Current Treatment: Anastrozole 1 mg daily started September 2018, discontinuing 10/28/2022    Anastrozole toxicities: 1.  Hot flashes: Mild 2. myalgias: Mild Patient has been taking CBD oil and she has noticed an improvement in her symptoms   Breast cancer surveillance: 1.  Mammogram: 06/02/2022 benign, breast density category C  2. breast exam 10/28/2022: Benign 3.  Bone density 05/31/2021: T score -2: Osteopenia: Currently on Fosamax along with calcium and vitamin D We ordered a bone density test.  Depending on that she will decide whether she can stop Fosamax.  If the bone density is T score less than -2 then she can stop it.    Patient stays very active by playing tennis and pickleball at the [employer] tennis courts She has a place upon [place] and goes there very often which is a suburb of [place].  Return to clinic on an as-needed basis. 

## 2022-11-02 ENCOUNTER — Encounter: Payer: Self-pay | Admitting: Hematology and Oncology

## 2022-11-07 ENCOUNTER — Ambulatory Visit (HOSPITAL_BASED_OUTPATIENT_CLINIC_OR_DEPARTMENT_OTHER)
Admission: RE | Admit: 2022-11-07 | Discharge: 2022-11-07 | Disposition: A | Discharge status: Home / Self Care | Payer: PPO | Source: Ambulatory Visit | Attending: Hematology and Oncology | Admitting: Hematology and Oncology

## 2022-11-07 DIAGNOSIS — M85851 Other specified disorders of bone density and structure, right thigh: Secondary | ICD-10-CM | POA: Diagnosis not present

## 2022-11-07 DIAGNOSIS — C50411 Malignant neoplasm of upper-outer quadrant of right female breast: Secondary | ICD-10-CM

## 2022-11-07 DIAGNOSIS — Z780 Asymptomatic menopausal state: Secondary | ICD-10-CM

## 2022-11-07 DIAGNOSIS — Z170 Estrogen receptor positive status [ER+]: Secondary | ICD-10-CM | POA: Insufficient documentation

## 2023-04-14 ENCOUNTER — Other Ambulatory Visit (HOSPITAL_COMMUNITY): Payer: Self-pay

## 2023-04-14 ENCOUNTER — Other Ambulatory Visit: Payer: Self-pay

## 2023-04-14 MED ORDER — BETAMETHASONE DIPROPIONATE AUG 0.05 % EX CREA
TOPICAL_CREAM | CUTANEOUS | 0 refills | Status: AC
  Filled 2023-04-14: qty 30, 28d supply, fill #0

## 2023-04-15 ENCOUNTER — Other Ambulatory Visit (HOSPITAL_COMMUNITY): Payer: Self-pay

## 2023-04-18 ENCOUNTER — Other Ambulatory Visit (HOSPITAL_COMMUNITY): Payer: Self-pay

## 2023-04-19 ENCOUNTER — Other Ambulatory Visit: Payer: Self-pay | Admitting: Physician Assistant

## 2023-04-19 DIAGNOSIS — Z1231 Encounter for screening mammogram for malignant neoplasm of breast: Secondary | ICD-10-CM

## 2023-06-05 ENCOUNTER — Ambulatory Visit
Admission: RE | Admit: 2023-06-05 | Discharge: 2023-06-05 | Disposition: A | Discharge status: Home / Self Care | Payer: PPO | Source: Ambulatory Visit | Attending: Physician Assistant | Admitting: Physician Assistant

## 2023-06-05 DIAGNOSIS — Z1231 Encounter for screening mammogram for malignant neoplasm of breast: Secondary | ICD-10-CM

## 2023-06-08 ENCOUNTER — Other Ambulatory Visit: Payer: Self-pay | Admitting: Physician Assistant

## 2023-06-08 DIAGNOSIS — R928 Other abnormal and inconclusive findings on diagnostic imaging of breast: Secondary | ICD-10-CM

## 2023-06-14 ENCOUNTER — Ambulatory Visit
Admission: RE | Admit: 2023-06-14 | Discharge: 2023-06-14 | Disposition: A | Discharge status: Home / Self Care | Payer: PPO | Source: Ambulatory Visit | Attending: Physician Assistant | Admitting: Physician Assistant

## 2023-06-14 DIAGNOSIS — R928 Other abnormal and inconclusive findings on diagnostic imaging of breast: Secondary | ICD-10-CM

## 2023-07-27 ENCOUNTER — Other Ambulatory Visit (HOSPITAL_COMMUNITY): Payer: Self-pay

## 2023-07-27 MED ORDER — AZITHROMYCIN 250 MG PO TABS
ORAL_TABLET | ORAL | 0 refills | Status: AC
  Filled 2023-07-27: qty 6, 5d supply, fill #0

## 2023-07-27 MED ORDER — PREDNISONE 10 MG PO TABS
ORAL_TABLET | ORAL | 0 refills | Status: AC
  Filled 2023-07-27: qty 21, 6d supply, fill #0

## 2023-08-30 ENCOUNTER — Other Ambulatory Visit (HOSPITAL_COMMUNITY): Payer: Self-pay

## 2023-08-30 MED ORDER — PREDNISOLONE ACETATE 1 % OP SUSP
1.0000 [drp] | Freq: Three times a day (TID) | OPHTHALMIC | 0 refills | Status: DC
  Filled 2023-08-30: qty 10, 21d supply, fill #0

## 2023-09-05 ENCOUNTER — Other Ambulatory Visit (HOSPITAL_COMMUNITY): Payer: Self-pay

## 2023-09-07 ENCOUNTER — Other Ambulatory Visit (HOSPITAL_COMMUNITY): Payer: Self-pay

## 2023-09-13 ENCOUNTER — Other Ambulatory Visit (HOSPITAL_COMMUNITY): Payer: Self-pay

## 2023-09-13 MED ORDER — PREDNISOLONE ACETATE 1 % OP SUSP
OPHTHALMIC | 0 refills | Status: AC
  Filled 2023-09-13: qty 10, 21d supply, fill #0
  Filled ????-??-??: fill #0

## 2023-12-26 DIAGNOSIS — H35412 Lattice degeneration of retina, left eye: Secondary | ICD-10-CM | POA: Diagnosis not present

## 2023-12-26 DIAGNOSIS — H43812 Vitreous degeneration, left eye: Secondary | ICD-10-CM | POA: Diagnosis not present

## 2024-02-16 ENCOUNTER — Other Ambulatory Visit (HOSPITAL_COMMUNITY): Payer: Self-pay

## 2024-02-16 DIAGNOSIS — L661 Lichen planopilaris, unspecified: Secondary | ICD-10-CM | POA: Diagnosis not present

## 2024-02-16 DIAGNOSIS — D225 Melanocytic nevi of trunk: Secondary | ICD-10-CM | POA: Diagnosis not present

## 2024-02-16 DIAGNOSIS — D2271 Melanocytic nevi of right lower limb, including hip: Secondary | ICD-10-CM | POA: Diagnosis not present

## 2024-02-16 DIAGNOSIS — L820 Inflamed seborrheic keratosis: Secondary | ICD-10-CM | POA: Diagnosis not present

## 2024-02-16 DIAGNOSIS — L710 Perioral dermatitis: Secondary | ICD-10-CM | POA: Diagnosis not present

## 2024-02-16 MED ORDER — CLOBETASOL PROPIONATE 0.05 % EX SOLN
1.0000 | Freq: Two times a day (BID) | CUTANEOUS | 2 refills | Status: AC
  Filled 2024-02-16: qty 50, 25d supply, fill #0

## 2024-02-16 MED ORDER — METRONIDAZOLE 1 % EX GEL
1.0000 | Freq: Two times a day (BID) | CUTANEOUS | 0 refills | Status: AC
  Filled 2024-02-16: qty 60, 30d supply, fill #0

## 2024-04-22 DIAGNOSIS — L661 Lichen planopilaris, unspecified: Secondary | ICD-10-CM | POA: Diagnosis not present

## 2024-04-22 DIAGNOSIS — L820 Inflamed seborrheic keratosis: Secondary | ICD-10-CM | POA: Diagnosis not present

## 2024-05-08 DIAGNOSIS — K080 Exfoliation of teeth due to systemic causes: Secondary | ICD-10-CM | POA: Diagnosis not present

## 2024-05-23 DIAGNOSIS — K080 Exfoliation of teeth due to systemic causes: Secondary | ICD-10-CM | POA: Diagnosis not present

## 2024-05-28 ENCOUNTER — Other Ambulatory Visit (HOSPITAL_COMMUNITY): Payer: Self-pay

## 2024-05-28 DIAGNOSIS — K080 Exfoliation of teeth due to systemic causes: Secondary | ICD-10-CM | POA: Diagnosis not present

## 2024-05-28 MED ORDER — AMOXICILLIN 500 MG PO CAPS
ORAL_CAPSULE | ORAL | 1 refills | Status: AC
  Filled 2024-05-28: qty 21, 7d supply, fill #0

## 2024-05-29 ENCOUNTER — Other Ambulatory Visit (HOSPITAL_COMMUNITY): Payer: Self-pay

## 2024-06-13 DIAGNOSIS — K080 Exfoliation of teeth due to systemic causes: Secondary | ICD-10-CM | POA: Diagnosis not present

## 2024-07-23 ENCOUNTER — Other Ambulatory Visit (HOSPITAL_COMMUNITY): Payer: Self-pay

## 2024-07-23 DIAGNOSIS — L6612 Frontal fibrosing alopecia: Secondary | ICD-10-CM | POA: Diagnosis not present

## 2024-07-23 MED ORDER — CLOBETASOL PROPIONATE 0.05 % EX SOLN
1.0000 | Freq: Every day | CUTANEOUS | 2 refills | Status: AC
  Filled 2024-07-23: qty 50, 30d supply, fill #0

## 2024-10-23 ENCOUNTER — Inpatient Hospital Stay

## 2024-10-23 ENCOUNTER — Encounter: Payer: Self-pay | Admitting: Genetic Counselor

## 2024-10-23 ENCOUNTER — Inpatient Hospital Stay: Attending: Genetic Counselor | Admitting: Genetic Counselor

## 2024-10-23 DIAGNOSIS — Z803 Family history of malignant neoplasm of breast: Secondary | ICD-10-CM

## 2024-10-23 DIAGNOSIS — Z170 Estrogen receptor positive status [ER+]: Secondary | ICD-10-CM | POA: Insufficient documentation

## 2024-10-23 DIAGNOSIS — Z79811 Long term (current) use of aromatase inhibitors: Secondary | ICD-10-CM | POA: Diagnosis not present

## 2024-10-23 DIAGNOSIS — Z923 Personal history of irradiation: Secondary | ICD-10-CM | POA: Insufficient documentation

## 2024-10-23 DIAGNOSIS — C50411 Malignant neoplasm of upper-outer quadrant of right female breast: Secondary | ICD-10-CM | POA: Diagnosis not present

## 2024-10-23 DIAGNOSIS — Z800 Family history of malignant neoplasm of digestive organs: Secondary | ICD-10-CM | POA: Insufficient documentation

## 2024-10-23 LAB — GENETIC SCREENING ORDER

## 2024-10-23 NOTE — Progress Notes (Addendum)
 REFERRING PROVIDER: N/a, self referral   PRIMARY PROVIDER:  Teresa Channel, MD   PRIMARY REASON FOR VISIT:  1. Malignant neoplasm of upper-outer quadrant of right breast in female, estrogen receptor positive (HCC)   2. Family history of malignant neoplasm of breast   3. Family history of malignant neoplasm of gastrointestinal tract     HISTORY OF PRESENT ILLNESS:   Diana Jacobson, a 68 y.o. female, was seen for a [place] cancer genetics consultation due to a personal and family history of cancer.  Diana Jacobson presents to clinic today to discuss the possibility of a hereditary predisposition to cancer, to discuss genetic testing, and to further clarify her future cancer risks, as well as potential cancer risks for family members.   In 2018, at the age of 68, Diana Jacobson was diagnosed with right breast cancer, IDC, ER+/PR-/Her2-. She was treated with lumpectomy, radiation and five years of endocrine therapy.     CANCER HISTORY:  Oncology History  Malignant neoplasm of upper-outer quadrant of right breast in female, estrogen receptor positive (HCC)  05/03/2017 Initial Diagnosis   Screening detected right breast calcifications 7 mm, right axilla negative, tumor biopsy grade 3 IDC with DCIS plus LCIS ER 100%, PR 2%, HER-2 negative ratio 1.45, Ki-67 25%, T1 BN 0 stage IA clinical stage   05/29/2017 Surgery   Right lumpectomy: IDC grade 2, 1.8 cm, DCIS intermediate grade, margins negative, 0/2 lymph nodes negative, ER 100%, PR 2%, HER-2 negative ratio 1.45, Ki-67 25%, T1c N0 stage IA   06/06/2017 Oncotype testing   Oncotype DX score 20:13% risk of recurrence   07/05/2017 - 08/09/2017 Radiation Therapy   Adj XRT at [facility]   08/2017 -  Anti-estrogen oral therapy   Anastrozole  daily      RELEVANT MEDICAL HISTORY:  Menarche was at age 66.  First live birth at age 66.  OCP use for approximately one month Ovaries intact: yes.  Uterus intact: yes.  Menopausal status: postmenopausal. Age  41 HRT use: one bioidentical hormone injection around age 76  Colonoscopy: yes; reports three total colonoscopies, one polyp detected. Normal cologuard last year  Mammogram within the last year: no. Last mammogram 2024  Past Medical History:  Diagnosis Date   Breast cancer (HCC) 05/03/2017   right breast cancer   GERD (gastroesophageal reflux disease)    Hypothyroidism    Osteopenia    Personal history of radiation therapy     Past Surgical History:  Procedure Laterality Date   BREAST BIOPSY Right 05/03/2017   positive/stereo bx   BREAST LUMPECTOMY Right 05/29/2017   seed placement 05/25/17   BREAST LUMPECTOMY WITH RADIOACTIVE SEED AND SENTINEL LYMPH NODE BIOPSY Right 05/29/2017   Procedure: BREAST LUMPECTOMY WITH RADIOACTIVE SEED AND SENTINEL LYMPH NODE BIOPSY;  Surgeon: Ebbie Cough, MD;  Location: [facility];  Service: General;  Laterality: Right;   ROTATOR CUFF REPAIR     TONSILLECTOMY      Social History   Socioeconomic History   Marital status: Married    Spouse name: Not on file   Number of children: Not on file   Years of education: Not on file   Highest education level: Not on file  Occupational History   Not on file  Tobacco Use   Smoking status: Former    Current packs/day: 0.00    Types: Cigarettes    Quit date: 05/11/1975    Years since quitting: 49.4   Smokeless tobacco: Never  Substance and Sexual Activity  Alcohol use: Yes    Alcohol/week: 5.0 standard drinks of alcohol    Types: 5 Glasses of wine per week    Comment: social   Drug use: No   Sexual activity: Not on file  Other Topics Concern   Not on file  Social History Narrative   Not on file   Social Drivers of Health   Financial Resource Strain: Low Risk  (11/17/2023)   Received from [facility]   Overall Financial Resource Strain (CARDIA)    Difficulty of Paying Living Expenses: Not hard at all  Food Insecurity: No Food Insecurity (11/17/2023)   Received from  [facility]   Hunger Vital Sign    Within the past 12 months, you worried that your food would run out before you got the money to buy more.: Never true    Within the past 12 months, the food you bought just didn't last and you didn't have money to get more.: Never true  Transportation Needs: No Transportation Needs (11/17/2023)   Received from [facility] - Transportation    Lack of Transportation (Medical): No    Lack of Transportation (Non-Medical): No  Physical Activity: Sufficiently Active (11/17/2023)   Received from [facility]   Exercise Vital Sign    On average, how many days per week do you engage in moderate to strenuous exercise (like a brisk walk)?: 4 days    On average, how many minutes do you engage in exercise at this level?: 60 min  Stress: No Stress Concern Present (11/17/2023)   Received from [facility] - Occupational Stress Questionnaire    Feeling of Stress : Not at all  Social Connections: Socially Integrated (11/17/2023)   Received from [facility]   Social Network    How would you rate your social network (family, work, friends)?: Good participation with social networks     FAMILY HISTORY:  We obtained a detailed, 4-generation family history.  Significant diagnoses are listed below: Family History  Problem Relation Age of Onset   Lung cancer Mother 72   Pancreatic cancer Father 59   Bladder Cancer Brother 42       smoking   Cystic fibrosis Brother    Bladder Cancer Paternal Aunt 35       smoking   Colon cancer Maternal Grandmother 12   Breast cancer Paternal Cousin 88   Colon cancer Other 66    Diana Jacobson reports her sister, age 59, no history of cancer had negative hereditary cancer genetic testing last year. Report unavailable for review. There is no reported Ashkenazi Jewish ancestry.     GENETIC COUNSELING ASSESSMENT: Diana Jacobson is a 68 y.o. female with a personal and family  history of cancer which is somewhat suggestive of a hereditary predisposition to cancer given her personal history of breast cancer and family history of a first degree relative with pancreatic cacner. We, therefore, discussed and recommended the following at today's visit.   DISCUSSION: We discussed that 5 - 10% of cancer is hereditary, with most cases of hereditary breast cancer are associated with pathogenic variants in BRCA1/2.  There are other genes that can be associated with hereditary breast cancer syndromes.  We discussed that testing is beneficial for several reasons including knowing how to follow individuals after completing their treatment, identifying whether potential treatment options would be beneficial, and understanding if other family members could be at risk for cancer and allowing them to  undergo genetic testing.   We reviewed the characteristics, features and inheritance patterns of hereditary cancer syndromes. We also discussed genetic testing, including the appropriate family members to test, the process of testing, insurance coverage and turn-around-time for results. We discussed the implications of a negative, positive, carrier and/or variant of uncertain significant result. We recommended Diana Jacobson pursue genetic testing for a panel that includes genes associated with breast, pancreatic and colon cancer.   Diana Jacobson  was offered a common hereditary cancer panel (40+ genes) and an expanded pan-cancer panel (70+ genes). Diana Jacobson was informed of the benefits and limitations of each panel, including that expanded pan-cancer panels contain genes that do not have clear management guidelines at this point in time.  We also discussed that as the number of genes included on a panel increases, the chances of variants of uncertain significance increases. After considering the benefits and limitations of each gene panel, Diana Jacobson elected to have [facility]'s Common Hereditary Cancers  +RNA panel. The [facility] Common Hereditary Cancers panel includes analysis of the following 48 genes: APC, ATM, AXIN2, BAP1, BARD1, BMPR1A, BRCA1, BRCA2, BRIP1, CDH1, CDK4, CDKN2A, CHEK2, CTNNA1, DICER1, EPCAM, FH, GREM1, HOXB13, KIT, MBD4, MEN1, MLH1, MSH2, MSH3, MSH6, MUTYH, NF1, NTHL1, PALB2, PDGFRA, PMS2, POLD1, POLE, PTEN, RAD51C, RAD51D, SDHA, SDHB, SDHC, SDHD, SMAD4, SMARCA4, STK11, TP53, TSC1, TSC2, VHL.   Based on Diana Jacobson's personal and family history of cancer, she meets medical criteria for genetic testing. Despite that she meets criteria, she may still have an out of pocket cost. We discussed that if her out of pocket cost for testing is over $100, the laboratory should contact them to discuss self-pay prices, patient pay assistance programs, if applicable, and other billing options.  We discussed that some people do not want to undergo genetic testing due to fear of genetic discrimination.  A federal law called the Genetic Information Non-Discrimination Act (GINA) of 2008 helps protect individuals against genetic discrimination based on their genetic test results.  It impacts both health insurance and employment.  With health insurance, it protects against increased premiums, being kicked off insurance or being forced to take a test in order to be insured.  For employment it protects against hiring, firing and promoting decisions based on genetic test results.  GINA does not apply to those in the [employer], those who work for companies with less than 15 employees, and new life insurance or long-term disability insurance policies.  Health status due to a cancer diagnosis is not protected under GINA.  PLAN: After considering the risks, benefits, and limitations, Diana Jacobson provided informed consent to pursue genetic testing and the blood sample was sent to [facility] for analysis of the Common Hereditary Cancers +RNA panel. Results should be available within approximately 2-3 weeks'  time, at which point they will be disclosed by telephone to Diana Jacobson, as will any additional recommendations warranted by these results. Diana Jacobson will receive a summary of her genetic counseling visit and a copy of her results once available. This information will also be available in Epic.   Lastly, we encouraged Diana Jacobson to remain in contact with cancer genetics annually so that we can continuously update the family history and inform her of any changes in cancer genetics and testing that may be of benefit for this family.   Diana Jacobson questions were answered to her satisfaction today. Our contact information was provided should additional questions or concerns arise. Thank you for the referral and allowing us   to share in the care of your patient.   Burnard Ogren, MS, [facility] Licensed, [email or web address] phone: [phone]   55 minutes were spent on the date of the encounter in service to the patient including preparation, face-to-face consultation, documentation and care coordination.  The patient was seen alone.  Drs. Gudena and/or Lanny were available to discuss this case as needed.   _______________________________________________________________________ For Office Staff:  Number of people involved in session: 1 Was an Intern/ student involved with case: no

## 2024-10-24 ENCOUNTER — Other Ambulatory Visit (HOSPITAL_COMMUNITY): Payer: Self-pay

## 2024-10-24 DIAGNOSIS — H0012 Chalazion right lower eyelid: Secondary | ICD-10-CM | POA: Diagnosis not present

## 2024-10-24 MED ORDER — DOXYCYCLINE HYCLATE 50 MG PO CAPS
50.0000 mg | ORAL_CAPSULE | Freq: Two times a day (BID) | ORAL | 0 refills | Status: AC
  Filled 2024-10-24: qty 14, 7d supply, fill #0

## 2024-10-30 ENCOUNTER — Telehealth: Payer: Self-pay | Admitting: Genetic Counselor

## 2024-10-30 ENCOUNTER — Ambulatory Visit: Payer: Self-pay | Admitting: Genetic Counselor

## 2024-10-30 DIAGNOSIS — Z1379 Encounter for other screening for genetic and chromosomal anomalies: Secondary | ICD-10-CM | POA: Insufficient documentation

## 2024-10-30 NOTE — Telephone Encounter (Signed)
 I spoke to Diana Jacobson to review results of genetic testing, completed with [facility] Common Hereditary Cancers panel +RNA. Testing did not identify any variants known to increase the risk for cancer.  Discussed that we do not know why she has had breast cancer or why there is cancer in the family. It is possible that the cancers in history occurred by chance or due to environmental factors. It is possible there are family members that have a variant that she did not inherit. It is also possible there is a hereditary cause for cancer that cannot be identified with current technology or due to a gene that was not tested. It will be important for her to keep in contact with genetics to keep up with whether additional testing may be needed.  Please see counseling note for further detail on this result.

## 2024-10-30 NOTE — Progress Notes (Signed)
 HPI:  Ms. Pieper was previously seen in the [facility] clinic due to a personal and family history of cancer and concerns regarding a hereditary predisposition to cancer. Please refer to our prior cancer genetics clinic note for more information regarding our discussion, assessment and recommendations, at the time. Ms. Vazquez recent genetic test results were disclosed to her, as were recommendations warranted by these results. These results and recommendations are discussed in more detail below.  Results were disclosed by telephone on 10/30/24.   CANCER HISTORY:  Oncology History  Malignant neoplasm of upper-outer quadrant of right breast in female, estrogen receptor positive (HCC)  05/03/2017 Initial Diagnosis   Screening detected right breast calcifications 7 mm, right axilla negative, tumor biopsy grade 3 IDC with DCIS plus LCIS ER 100%, PR 2%, HER-2 negative ratio 1.45, Ki-67 25%, T1 BN 0 stage IA clinical stage   05/29/2017 Surgery   Right lumpectomy: IDC grade 2, 1.8 cm, DCIS intermediate grade, margins negative, 0/2 lymph nodes negative, ER 100%, PR 2%, HER-2 negative ratio 1.45, Ki-67 25%, T1c N0 stage IA   06/06/2017 Oncotype testing   Oncotype DX score 20:13% risk of recurrence   07/05/2017 - 08/09/2017 Radiation Therapy   Adj XRT at [facility]   08/2017 -  Anti-estrogen oral therapy   Anastrozole  daily   10/29/2024 Genetic Testing   Negative Common Hereditary Cancers panel +RNA. The [facility] Common Hereditary Cancers panel includes analysis of the following 48 genes: APC, ATM, AXIN2, BAP1, BARD1, BMPR1A, BRCA1, BRCA2, BRIP1, CDH1, CDK4, CDKN2A, CHEK2, CTNNA1, DICER1, EPCAM, FH, GREM1, HOXB13, KIT, MBD4, MEN1, MLH1, MSH2, MSH3, MSH6, MUTYH, NF1, NTHL1, PALB2, PDGFRA, PMS2, POLD1, POLE, PTEN, RAD51C, RAD51D, SDHA, SDHB, SDHC, SDHD, SMAD4, SMARCA4, STK11, TP53, TSC1, TSC2, VHL. RNA analysis included on applicable genes. Report date 10/29/24.      FAMILY HISTORY:  We  obtained a detailed, 4-generation family history.  Significant diagnoses are listed below: Family History  Problem Relation Age of Onset   Lung cancer Mother 56   Pancreatic cancer Father 37   Bladder Cancer Brother 56       smoking   Cystic fibrosis Brother    Bladder Cancer Paternal Aunt 47       smoking   Colon cancer Maternal Grandmother 46   Breast cancer Paternal Cousin 45   Colon cancer Other 41    Ms. Riccardi reports her sister, age 84, no history of cancer had negative hereditary cancer genetic testing last year. Report unavailable for review. There is no reported Ashkenazi Jewish ancestry.      GENETIC TEST RESULTS: Genetic testing reported out on 10/29/24 through the [facility] Common Hereditary Cancers +RNA panel found no pathogenic mutations. The [facility] Common Hereditary Cancers panel includes analysis of the following 48 genes: APC, ATM, AXIN2, BAP1, BARD1, BMPR1A, BRCA1, BRCA2, BRIP1, CDH1, CDK4, CDKN2A, CHEK2, CTNNA1, DICER1, EPCAM, FH, GREM1, HOXB13, KIT, MBD4, MEN1, MLH1, MSH2, MSH3, MSH6, MUTYH, NF1, NTHL1, PALB2, PDGFRA, PMS2, POLD1, POLE, PTEN, RAD51C, RAD51D, SDHA, SDHB, SDHC, SDHD, SMAD4, SMARCA4, STK11, TP53, TSC1, TSC2, VHL. RNA analysis included on applicable genes. The test report has been scanned into EPIC and is located under the Molecular Pathology section of the Results Review tab.  A portion of the result report is included below for reference.     We discussed with Ms. Griego that because current genetic testing is not perfect, it is possible there may be a gene mutation in one of these genes that current testing cannot detect, but  that chance is small.  We also discussed, that there could be another gene that has not yet been discovered, or that we have not yet tested, that is responsible for the cancer diagnoses in the family. It is also possible there is a hereditary cause for the cancer in the family that Ms. Nuss did not inherit and therefore was not  identified in her testing.  Therefore, it is important to remain in touch with cancer genetics in the future so that we can continue to offer Ms. Gibbins the most up to date genetic testing.   ADDITIONAL GENETIC TESTING: We discussed with Ms. Tullis that there are other genes that are associated with increased cancer risk that can be analyzed. Should Ms. Kinn wish to pursue additional genetic testing, we are happy to discuss and coordinate this testing, at any time.    CANCER SCREENING RECOMMENDATIONS: Ms. Smithers test result is considered negative (normal).  This means that we have not identified a hereditary cause for her personal and family history of cancer at this time. Most cancers happen by chance and this negative test suggests that her personal and family history of cancer may fall into this category.    Possible reasons for Ms. Kama's negative genetic test include:  1. There may be a gene mutation in one of these genes that current testing methods cannot detect. The likelihood of this is low, but possible.   2. There could be another gene that has not yet been discovered, or that we have not yet tested, that is responsible for the cancer diagnoses in the family.  3.  There may be no hereditary risk for cancer in the family. The cancers in Ms. Mijangos and/or her family may be sporadic/familial or due to other genetic and environmental factors. 4. It is also possible there is a hereditary cause for the cancer in the family that Ms. Vitale did not inherit.  Therefore, it is recommended she continue to follow the cancer management and screening guidelines provided by her oncology and primary healthcare provider. An individual's cancer risk and medical management are not determined by genetic test results alone. Overall cancer risk assessment incorporates additional factors, including personal medical history, family history, and any available genetic information that may result in a  personalized plan for cancer prevention and surveillance  Given Ms. Bures's personal and family histories, we must interpret these negative results with some caution.  Families with features suggestive of hereditary risk for cancer tend to have multiple family members with cancer, diagnoses in multiple generations and diagnoses before the age of 75. Ms. Devora family exhibits some of these features. Thus, this result may simply reflect our current inability to detect all mutations within these genes or there may be a different gene that has not yet been discovered or tested.   RECOMMENDATIONS FOR FAMILY MEMBERS:  Individuals in this family might be at some increased risk of developing cancer, over the general population risk, simply due to the family history of cancer.  We recommended women in this family have a yearly mammogram beginning at age 10, or 1 years younger than the earliest onset of cancer, an annual clinical breast exam, and perform monthly breast self-exams. Women in the family can be evaluated by their providers to determine if they would qualify for earlier or increased screening based on the family history. Women in this family should also have a gynecological exam as recommended by their primary provider. All family members should be referred for  colonoscopy starting at age 30, or 74 years younger than the earliest onset of cancer.  Other relatives may benefit from completing their own genetic testing, especially if they have been diagnosed with cancer. For her children, genetic testing is likely to be of lower clinical utility given her normal result. Testing could be considered if they have paternal family history of cancer.   FOLLOW-UP: Lastly, we discussed with Ms. Priego that cancer genetics is a rapidly advancing field and it is possible that new genetic tests will be appropriate for her and/or her family members in the future. We encouraged her to remain in contact with cancer  genetics so we can update her personal and family histories and let her know of advances in cancer genetics that may benefit this family.   Our contact number was provided. Ms. Amesquita questions were answered to her satisfaction, and she knows she is welcome to call us  at anytime with additional questions or concerns.   Burnard Ogren, MS, [facility] Licensed, [email or web address] [phone]

## 2024-11-18 DIAGNOSIS — E039 Hypothyroidism, unspecified: Secondary | ICD-10-CM | POA: Diagnosis not present

## 2024-11-18 DIAGNOSIS — D72819 Decreased white blood cell count, unspecified: Secondary | ICD-10-CM | POA: Diagnosis not present

## 2024-11-18 DIAGNOSIS — M8588 Other specified disorders of bone density and structure, other site: Secondary | ICD-10-CM | POA: Diagnosis not present

## 2024-11-20 DIAGNOSIS — D72819 Decreased white blood cell count, unspecified: Secondary | ICD-10-CM | POA: Diagnosis not present

## 2024-11-20 DIAGNOSIS — E039 Hypothyroidism, unspecified: Secondary | ICD-10-CM | POA: Diagnosis not present

## 2024-11-20 DIAGNOSIS — M8588 Other specified disorders of bone density and structure, other site: Secondary | ICD-10-CM | POA: Diagnosis not present

## 2024-11-20 DIAGNOSIS — Z1331 Encounter for screening for depression: Secondary | ICD-10-CM | POA: Diagnosis not present

## 2024-11-20 DIAGNOSIS — E063 Autoimmune thyroiditis: Secondary | ICD-10-CM | POA: Diagnosis not present

## 2024-11-20 DIAGNOSIS — Z0000 Encounter for general adult medical examination without abnormal findings: Secondary | ICD-10-CM | POA: Diagnosis not present

## 2024-11-21 ENCOUNTER — Other Ambulatory Visit (HOSPITAL_BASED_OUTPATIENT_CLINIC_OR_DEPARTMENT_OTHER): Payer: Self-pay | Admitting: Family Medicine

## 2024-11-21 DIAGNOSIS — M8580 Other specified disorders of bone density and structure, unspecified site: Secondary | ICD-10-CM

## 2024-11-27 DIAGNOSIS — K080 Exfoliation of teeth due to systemic causes: Secondary | ICD-10-CM | POA: Diagnosis not present
